# Patient Record
Sex: Male | Born: 1955 | Race: White | Hispanic: No | Marital: Married | Smoking: Never smoker
Health system: Southern US, Community
[De-identification: ages and names within clinical notes are randomized; demographics above are authoritative.]

## PROBLEM LIST (undated history)

## (undated) DIAGNOSIS — J302 Other seasonal allergic rhinitis: Secondary | ICD-10-CM

## (undated) DIAGNOSIS — E785 Hyperlipidemia, unspecified: Secondary | ICD-10-CM

## (undated) DIAGNOSIS — I10 Essential (primary) hypertension: Secondary | ICD-10-CM

## (undated) DIAGNOSIS — M1990 Unspecified osteoarthritis, unspecified site: Secondary | ICD-10-CM

## (undated) DIAGNOSIS — K219 Gastro-esophageal reflux disease without esophagitis: Secondary | ICD-10-CM

## (undated) DIAGNOSIS — Z8639 Personal history of other endocrine, nutritional and metabolic disease: Secondary | ICD-10-CM

## (undated) DIAGNOSIS — E119 Type 2 diabetes mellitus without complications: Secondary | ICD-10-CM

## (undated) HISTORY — PX: TONSILLECTOMY: SUR1361

---

## 1992-02-23 HISTORY — PX: KNEE ARTHROSCOPY: SUR90

## 2008-12-03 ENCOUNTER — Encounter: Admission: RE | Admit: 2008-12-03 | Discharge: 2008-12-03 | Payer: Self-pay | Admitting: Sports Medicine

## 2011-02-04 ENCOUNTER — Ambulatory Visit: Payer: Self-pay | Admitting: Family Medicine

## 2011-12-27 IMAGING — US US PELVIS LIMITED
1 series · 17 of 25 positions shown · non-contrast
Comparison: none

REASON FOR EXAM: left scrotal mass above testicles eval hydrocele
COMMENTS:

[Series 1: us pelvis limited · 17 of 74 slices shown]
[im 1/74]
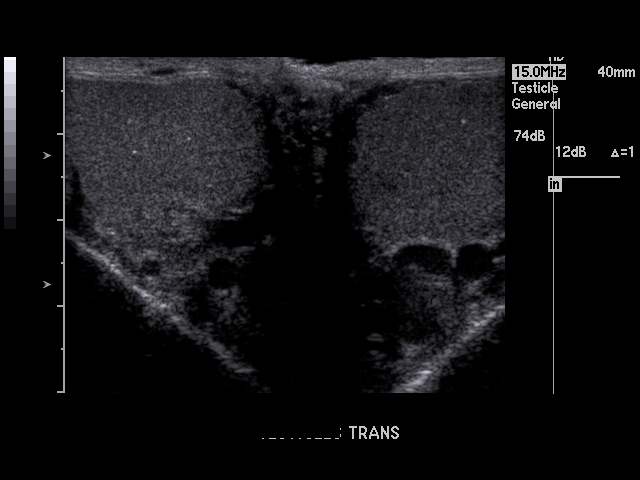
[im 7/74]
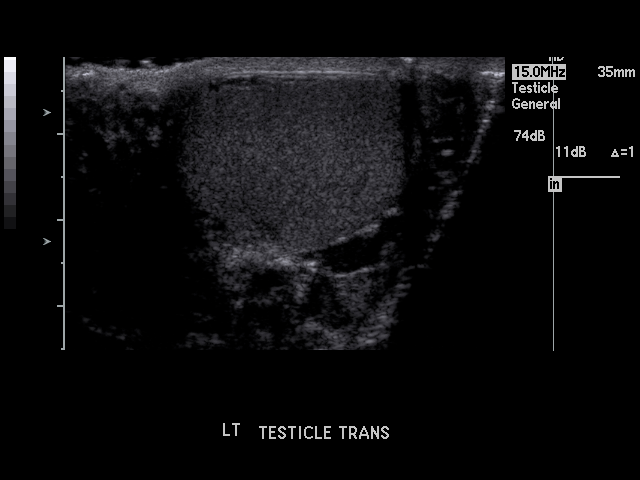
[im 10/74]
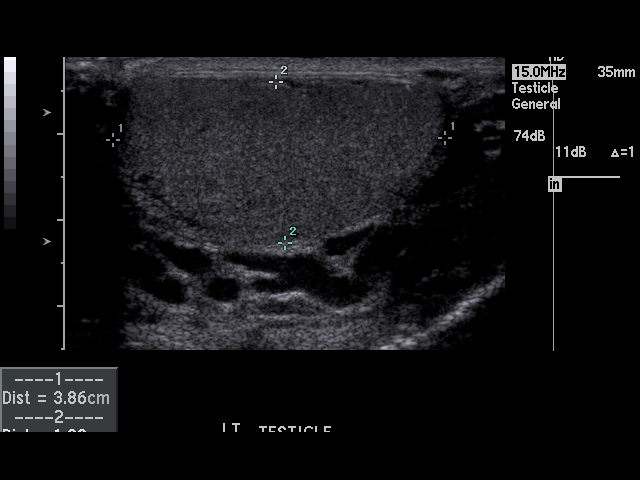
[im 16/74]
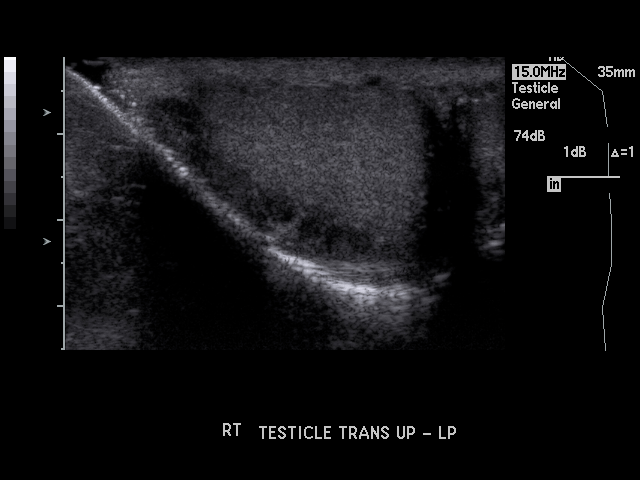
[im 19/74]
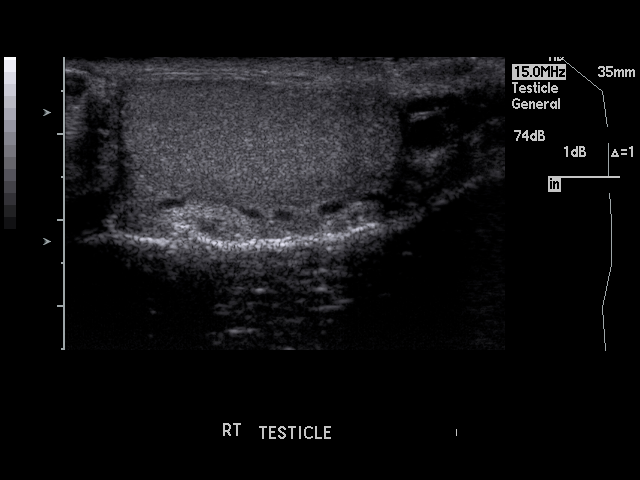
[im 25/74]
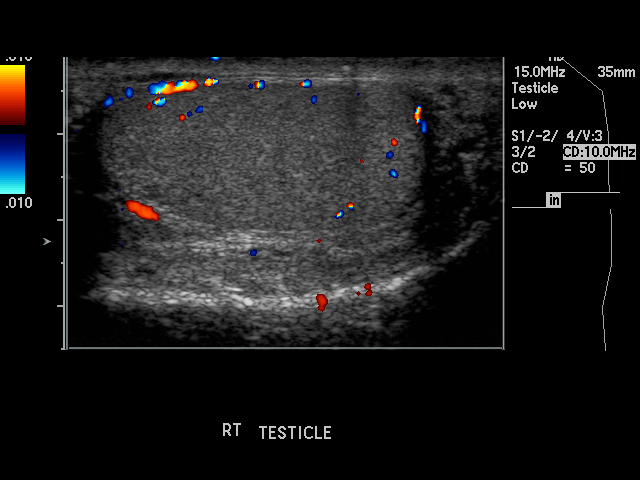
[im 28/74]
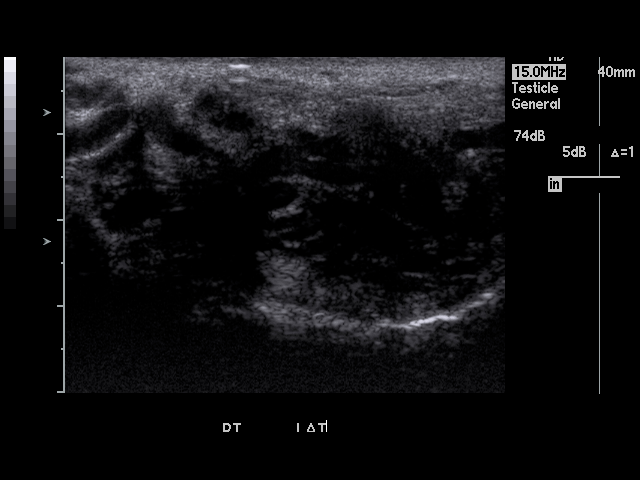
[im 34/74]
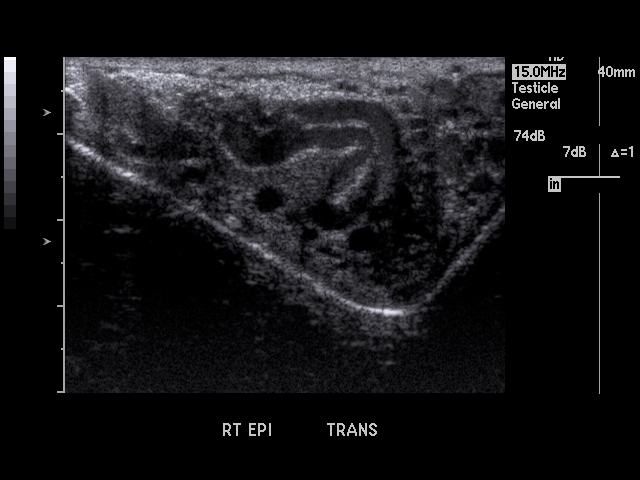
[im 37/74]
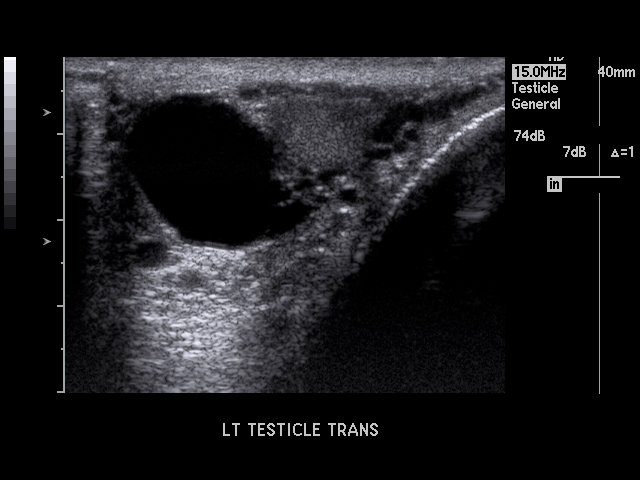
[im 40/74]
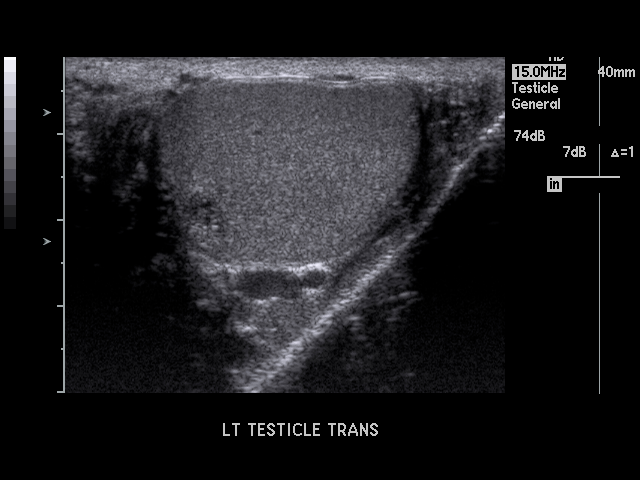
[im 46/74]
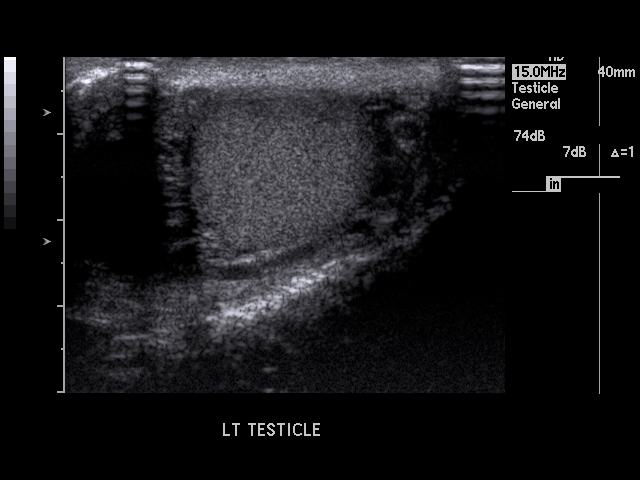
[im 49/74]
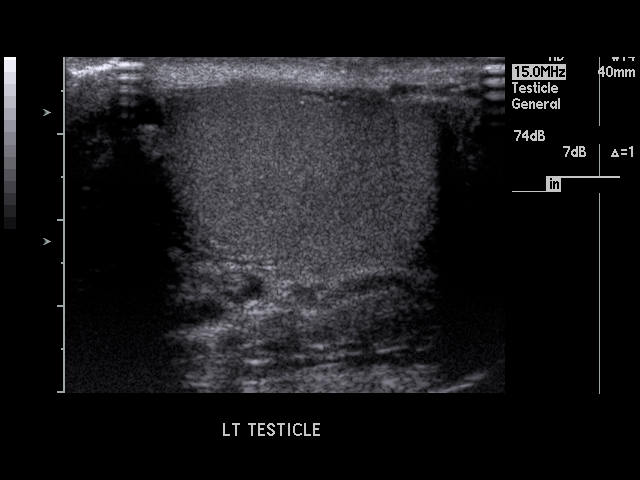
[im 55/74]
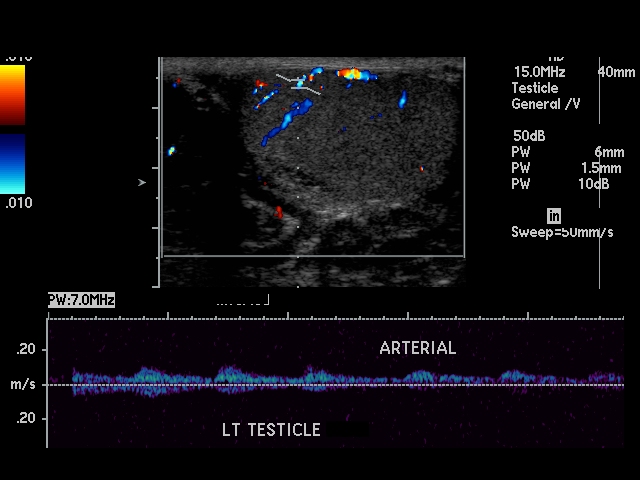
[im 58/74]
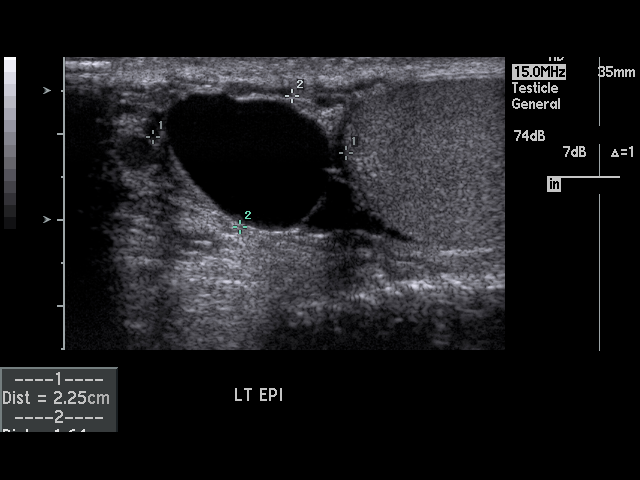
[im 64/74]
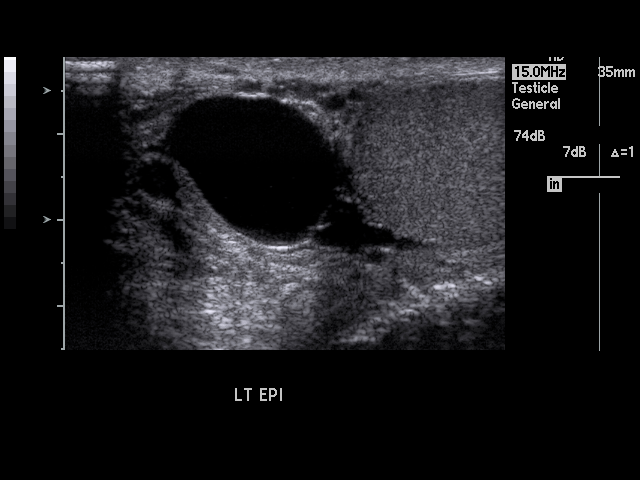
[im 67/74]
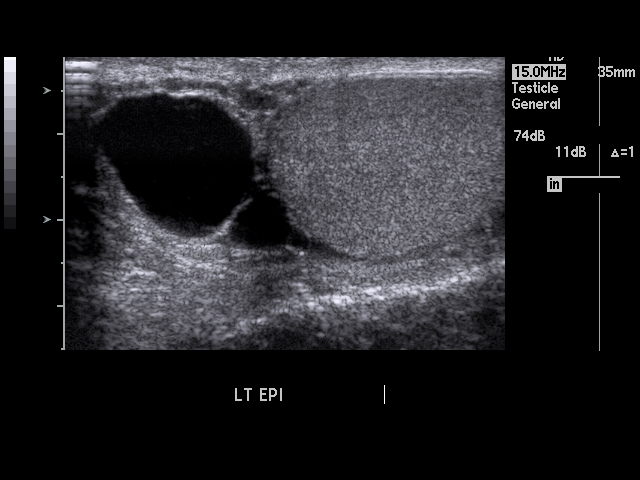
[im 74/74]
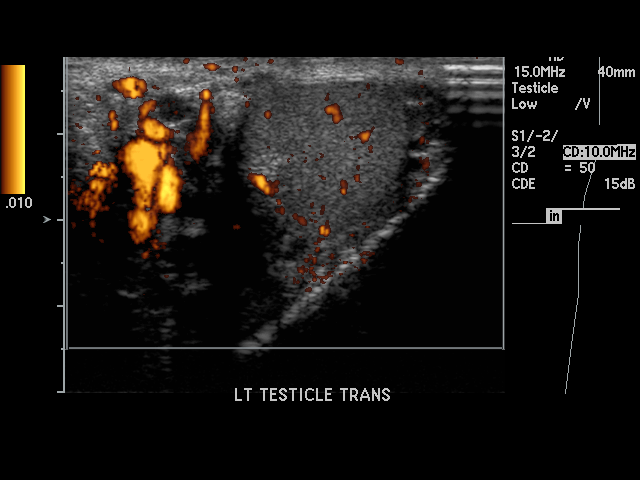

[17 of 25 positions shown; findings below may reference images not displayed]

PROCEDURE:     ALIM - ALIM TESTICULAR  - February 04, 2011  [DATE]

RESULT:

Scrotal Ultrasound demonstrates the right testicle measures 4.23 x 2.20 x
2.75 cm. The left measures 3.86 x 1.88 x 2.71 cm. Epididymal appearance is
grossly normal. A left epididymal cyst with thin septations measures 2.55 x
2.08 x 1.50 cm. Bilateral varicoceles are present. On the right, this makes
visualization of the epididymis difficult. Color Doppler and spectral
Doppler appearance demonstrates testicular blood flow appears within normal
limits bilaterally. Microcalcifications are present bilaterally within the
testicular tissue.
IMPRESSION: 1.  Bilateral varicoceles.
2.  Testicular microcalcifications.
3.  Epididymal cyst with thin septations on the left measures up to 2.55 x
2.28 x 1.50 cm.

## 2014-08-05 NOTE — Progress Notes (Signed)
Please put orders in Epic surgery 08-16-14 Thanks

## 2014-08-07 ENCOUNTER — Ambulatory Visit: Payer: Self-pay | Admitting: Orthopedic Surgery

## 2014-08-07 MED ORDER — ACETAMINOPHEN 10 MG/ML IV SOLN: 1000.0000 mg | INTRAVENOUS | Status: AC

## 2014-08-07 MED ORDER — SODIUM CHLORIDE 0.9 % IV SOLN: 4.0000 mg | INTRAVENOUS | Status: AC

## 2014-08-07 MED ORDER — DEXAMETHASONE SODIUM PHOSPHATE 4 MG/ML IJ SOLN: 4.0000 mg | INTRAMUSCULAR | Status: AC

## 2014-08-09 NOTE — Patient Instructions (Addendum)
YOUR PROCEDURE IS SCHEDULED ON : 08/16/14  REPORT TO [facility] MAIN ENTRANCE FOLLOW SIGNS TO SHORT STAY CENTER AT :  5:30 am  CALL THIS NUMBER IF YOU HAVE PROBLEMS THE MORNING OF SURGERY [phone]  REMEMBER:ONLY 1 PER PERSON MAY GO TO SHORT STAY WITH YOU TO GET READY THE MORNING OF YOUR SURGERY  DO NOT EAT FOOD OR DRINK LIQUIDS AFTER MIDNIGHT  TAKE THESE MEDICINES THE MORNING OF SURGERY:  NONE  YOU MAY NOT HAVE ANY METAL ON YOUR BODY INCLUDING HAIR PINS AND PIERCING'S. DO NOT WEAR JEWELRY, MAKEUP, LOTIONS, POWDERS OR PERFUMES. DO NOT WEAR NAIL POLISH. DO NOT SHAVE 48 HRS PRIOR TO SURGERY. MEN MAY SHAVE FACE AND NECK.  DO NOT BRING VALUABLES TO HOSPITAL. [facility] IS NOT RESPONSIBLE FOR VALUABLES.  CONTACTS, DENTURES OR PARTIALS MAY NOT BE WORN TO SURGERY. LEAVE SUITCASE IN CAR. CAN BE BROUGHT TO ROOM AFTER SURGERY.  PATIENTS DISCHARGED THE DAY OF SURGERY WILL NOT BE ALLOWED TO DRIVE HOME.  PLEASE READ OVER THE FOLLOWING INSTRUCTION SHEETS _________________________________________________________________________________                                          [facility] - PREPARING FOR SURGERY  Before surgery, you can play an important role.  Because skin is not sterile, your skin needs to be as free of germs as possible.  You can reduce the number of germs on your skin by washing with CHG (chlorahexidine gluconate) soap before surgery.  CHG is an antiseptic cleaner which kills germs and bonds with the skin to continue killing germs even after washing. Please DO NOT use if you have an allergy to CHG or antibacterial soaps.  If your skin becomes reddened/irritated stop using the CHG and inform your nurse when you arrive at Short Stay. Do not shave (including legs and underarms) for at least 48 hours prior to the first CHG shower.  You may shave your face. Please follow these instructions carefully:   1.  Shower with CHG Soap the night before surgery and the   morning of Surgery.   2.  If you choose to wash your hair, wash your hair first as usual with your  normal  Shampoo.   3.  After you shampoo, rinse your hair and body thoroughly to remove the  shampoo.                                         4.  Use CHG as you would any other liquid soap.  You can apply chg directly  to the skin and wash . Gently wash with scrungie or clean wascloth    5.  Apply the CHG Soap to your body ONLY FROM THE NECK DOWN.   Do not use on open                           Wound or open sores. Avoid contact with eyes, ears mouth and genitals (private parts).                        Genitals (private parts) with your normal soap.              6.  Wash thoroughly, paying special attention to the area where your surgery  will be performed.   7.  Thoroughly rinse your body with warm water from the neck down.   8.  DO NOT shower/wash with your normal soap after using and rinsing off  the CHG Soap .                9.  Pat yourself dry with a clean towel.             10.  Wear clean night clothes to bed after shower             11.  Place clean sheets on your bed the night of your first shower and do not  sleep with pets.  Day of Surgery : Do not apply any lotions/deodorants the morning of surgery.  Please wear clean clothes to the hospital/surgery center.  FAILURE TO FOLLOW THESE INSTRUCTIONS MAY RESULT IN THE CANCELLATION OF YOUR SURGERY    PATIENT SIGNATURE_________________________________  ______________________________________________________________________     Darren Bean  An incentive spirometer is a tool that can help keep your lungs clear and active. This tool measures how well you are filling your lungs with each breath. Taking long deep breaths may help reverse or decrease the chance of developing breathing (pulmonary) problems (especially infection) following:  A long period of time when you are unable to move or be active. BEFORE THE  PROCEDURE   If the spirometer includes an indicator to show your best effort, your nurse or respiratory therapist will set it to a desired goal.  If possible, sit up straight or lean slightly forward. Try not to slouch.  Hold the incentive spirometer in an upright position. INSTRUCTIONS FOR USE   Sit on the edge of your bed if possible, or sit up as far as you can in bed or on a chair.  Hold the incentive spirometer in an upright position.  Breathe out normally.  Place the mouthpiece in your mouth and seal your lips tightly around it.  Breathe in slowly and as deeply as possible, raising the piston or the ball toward the top of the column.  Hold your breath for 3-5 seconds or for as long as possible. Allow the piston or ball to fall to the bottom of the column.  Remove the mouthpiece from your mouth and breathe out normally.  Rest for a few seconds and repeat Steps 1 through 7 at least 10 times every 1-2 hours when you are awake. Take your time and take a few normal breaths between deep breaths.  The spirometer may include an indicator to show your best effort. Use the indicator as a goal to work toward during each repetition.  After each set of 10 deep breaths, practice coughing to be sure your lungs are clear. If you have an incision (the cut made at the time of surgery), support your incision when coughing by placing a pillow or rolled up towels firmly against it. Once you are able to get out of bed, walk around indoors and cough well. You may stop using the incentive spirometer when instructed by your caregiver.  RISKS AND COMPLICATIONS  Take your time so you do not get dizzy or light-headed.  If you are in pain, you may need to take or ask for pain medication before doing incentive spirometry. It is harder to take a deep breath if you are having pain. AFTER USE  Rest and breathe slowly and easily.  It  can be helpful to keep track of a log of your progress. Your caregiver  can provide you with a simple table to help with this. If you are using the spirometer at home, follow these instructions: SEEK MEDICAL CARE IF:   You are having difficultly using the spirometer.  You have trouble using the spirometer as often as instructed.  Your pain medication is not giving enough relief while using the spirometer.  You develop fever of 100.5 F (38.1 C) or higher. SEEK IMMEDIATE MEDICAL CARE IF:   You cough up bloody sputum that had not been present before.  You develop fever of 102 F (38.9 C) or greater.  You develop worsening pain at or near the incision site. MAKE SURE YOU:   Understand these instructions.  Will watch your condition.  Will get help right away if you are not doing well or get worse. Document Released: 06/21/2006 Document Revised: 05/03/2011 Document Reviewed: 08/22/2006 [facility] Patient Information 2014 [place], [place].

## 2014-08-12 ENCOUNTER — Encounter (HOSPITAL_COMMUNITY): Payer: Self-pay

## 2014-08-12 ENCOUNTER — Encounter (HOSPITAL_COMMUNITY)
Admission: RE | Admit: 2014-08-12 | Discharge: 2014-08-12 | Disposition: A | Discharge status: Home / Self Care | Payer: BC Managed Care – PPO | Source: Ambulatory Visit | Attending: Orthopedic Surgery | Admitting: Orthopedic Surgery

## 2014-08-12 DIAGNOSIS — M1611 Unilateral primary osteoarthritis, right hip: Secondary | ICD-10-CM | POA: Diagnosis not present

## 2014-08-12 DIAGNOSIS — Z01810 Encounter for preprocedural cardiovascular examination: Secondary | ICD-10-CM | POA: Insufficient documentation

## 2014-08-12 DIAGNOSIS — Z01812 Encounter for preprocedural laboratory examination: Secondary | ICD-10-CM | POA: Insufficient documentation

## 2014-08-12 HISTORY — DX: Unspecified osteoarthritis, unspecified site: M19.90

## 2014-08-12 HISTORY — DX: Personal history of other endocrine, nutritional and metabolic disease: Z86.39

## 2014-08-12 HISTORY — DX: Other seasonal allergic rhinitis: J30.2

## 2014-08-12 HISTORY — DX: Gastro-esophageal reflux disease without esophagitis: K21.9

## 2014-08-12 LAB — PROTIME-INR
INR: 1.01 (ref 0.00–1.49)
PROTHROMBIN TIME: 13.5 s (ref 11.6–15.2)

## 2014-08-12 LAB — COMPREHENSIVE METABOLIC PANEL
ALT: 20 U/L (ref 17–63)
AST: 16 U/L (ref 15–41)
Albumin: 4.2 g/dL (ref 3.5–5.0)
Alkaline Phosphatase: 50 U/L (ref 38–126)
Anion gap: 7 (ref 5–15)
BILIRUBIN TOTAL: 0.5 mg/dL (ref 0.3–1.2)
BUN: 19 mg/dL (ref 6–20)
CALCIUM: 8.8 mg/dL — AB (ref 8.9–10.3)
CHLORIDE: 105 mmol/L (ref 101–111)
CO2: 26 mmol/L (ref 22–32)
CREATININE: 0.94 mg/dL (ref 0.61–1.24)
GLUCOSE: 168 mg/dL — AB (ref 65–99)
Potassium: 3.8 mmol/L (ref 3.5–5.1)
Sodium: 138 mmol/L (ref 135–145)
Total Protein: 6.8 g/dL (ref 6.5–8.1)

## 2014-08-12 LAB — CBC
HCT: 45.8 % (ref 39.0–52.0)
HEMOGLOBIN: 15.2 g/dL (ref 13.0–17.0)
MCH: 28.4 pg (ref 26.0–34.0)
MCHC: 33.2 g/dL (ref 30.0–36.0)
MCV: 85.6 fL (ref 78.0–100.0)
Platelets: 201 10*3/uL (ref 150–400)
RBC: 5.35 MIL/uL (ref 4.22–5.81)
RDW: 12.6 % (ref 11.5–15.5)
WBC: 5.1 10*3/uL (ref 4.0–10.5)

## 2014-08-12 LAB — APTT: APTT: 28 s (ref 24–37)

## 2014-08-12 LAB — SURGICAL PCR SCREEN
MRSA, PCR: NEGATIVE
Staphylococcus aureus: POSITIVE — AB

## 2014-08-12 LAB — NO BLOOD PRODUCTS

## 2014-08-12 NOTE — Progress Notes (Signed)
Rx Mupuricin called to [facility] [phone] pt notified

## 2014-08-12 NOTE — Progress Notes (Signed)
   08/12/14 0815  OBSTRUCTIVE SLEEP APNEA  Have you ever been diagnosed with sleep apnea through a sleep study? No  Do you snore loudly (loud enough to be heard through closed doors)?  0  Do you often feel tired, fatigued, or sleepy during the daytime? 0  Has anyone observed you stop breathing during your sleep? 0  Do you have, or are you being treated for high blood pressure? 0  BMI more than 35 kg/m2? 1  Age over 57 years old? 1  Neck circumference greater than 40 cm/16 inches? 1  Gender: 1

## 2014-08-13 ENCOUNTER — Ambulatory Visit: Payer: Self-pay | Admitting: Orthopedic Surgery

## 2014-08-13 NOTE — H&P (Signed)
TOTAL HIP ADMISSION H&P  Patient is admitted for right total hip arthroplasty.  Subjective:  Chief Complaint: right hip pain  HPI: Darren Bean, 59 y.o. male, has a history of pain and functional disability in the right hip(s) due to arthritis and patient has failed non-surgical conservative treatments for greater than 12 weeks to include NSAID's and/or analgesics, flexibility and strengthening excercises, use of assistive devices, weight reduction as appropriate and activity modification.  Onset of symptoms was gradual starting 4 years ago with gradually worsening course since that time.The patient noted no past surgery on the right hip(s).  Patient currently rates pain in the right hip at 10 out of 10 with activity. Patient has night pain, worsening of pain with activity and weight bearing, pain that interfers with activities of daily living and pain with passive range of motion. Patient has evidence of subchondral cysts, subchondral sclerosis, periarticular osteophytes and joint space narrowing by imaging studies. This condition presents safety issues increasing the risk of falls. There is no current active infection.  There are no active problems to display for this patient.  Past Medical History  Diagnosis Date  . Seasonal allergies   . Arthritis   . GERD (gastroesophageal reflux disease)     takes tums as needed  . Hx of diabetes mellitus     taken off metformin after wt loss - glucose  became normal    Past Surgical History  Procedure Laterality Date  . Tonsillectomy    . Knee arthroscopy  1994    rt knee     (Not in a hospital admission) Allergies  Allergen Reactions  . Other     Refusal of blood- Jehova Witness.     History  Substance Use Topics  . Smoking status: Never Smoker   . Smokeless tobacco: Not on file  . Alcohol Use: Yes     Comment: social    No family history on file.   Review of Systems  Constitutional: Negative.   HENT: Negative.   Eyes: Negative.    Respiratory: Negative.   Cardiovascular: Negative.   Gastrointestinal: Negative.   Genitourinary: Negative.   Musculoskeletal: Positive for back pain and joint pain.  Skin: Negative.   Neurological: Negative.   Endo/Heme/Allergies: Negative.   Psychiatric/Behavioral: Negative.     Objective:  Physical Exam  Vitals reviewed. Constitutional: He is oriented to person, place, and time. He appears well-developed and well-nourished.  HENT:  Head: Normocephalic and atraumatic.  Eyes: Conjunctivae and EOM are normal. Pupils are equal, round, and reactive to light.  Neck: Normal range of motion. Neck supple.  Cardiovascular: Normal rate, regular rhythm, normal heart sounds and intact distal pulses.   Respiratory: Effort normal and breath sounds normal. No respiratory distress.  GI: Soft. Bowel sounds are normal. He exhibits no distension. There is no tenderness.  Genitourinary:  deferred  Musculoskeletal:       Right hip: He exhibits decreased range of motion.  Neurological: He is alert and oriented to person, place, and time. He has normal reflexes.  Skin: Skin is warm and dry.  Psychiatric: He has a normal mood and affect. His behavior is normal. Judgment and thought content normal.    Vital signs in last 24 hours: @VSRANGES @  Labs:   Estimated body mass index is 35.72 kg/(m^2) as calculated from the following:   Height as of 08/12/14: 5\' 9"  (1.753 m).   Weight as of 08/12/14: 109.77 kg (242 lb).   Imaging Review Plain radiographs demonstrate  severe degenerative joint disease of the right hip(s). The bone quality appears to be adequate for age and reported activity level.  Assessment/Plan:  End stage arthritis, right hip(s)  The patient history, physical examination, clinical judgement of the provider and imaging studies are consistent with end stage degenerative joint disease of the right hip(s) and total hip arthroplasty is deemed medically necessary. The treatment  options including medical management, injection therapy, arthroscopy and arthroplasty were discussed at length. The risks and benefits of total hip arthroplasty were presented and reviewed. The risks due to aseptic loosening, infection, stiffness, dislocation/subluxation,  thromboembolic complications and other imponderables were discussed.  The patient acknowledged the explanation, agreed to proceed with the plan and consent was signed. Patient is being admitted for inpatient treatment for surgery, pain control, PT, OT, prophylactic antibiotics, VTE prophylaxis, progressive ambulation and ADL's and discharge planning.The patient is planning to be discharged home with home health services. Jehovah's witness - no blood products.

## 2014-08-16 ENCOUNTER — Inpatient Hospital Stay (HOSPITAL_COMMUNITY): Payer: BC Managed Care – PPO

## 2014-08-16 ENCOUNTER — Encounter (HOSPITAL_COMMUNITY): Payer: Self-pay | Admitting: *Deleted

## 2014-08-16 ENCOUNTER — Inpatient Hospital Stay (HOSPITAL_COMMUNITY): Payer: BC Managed Care – PPO | Admitting: Anesthesiology

## 2014-08-16 ENCOUNTER — Inpatient Hospital Stay (HOSPITAL_COMMUNITY)
Admission: RE | Admit: 2014-08-16 | Discharge: 2014-08-17 | DRG: 470 | Disposition: A | Discharge status: Home Health | Payer: BC Managed Care – PPO | Source: Ambulatory Visit | Attending: Orthopedic Surgery | Admitting: Orthopedic Surgery

## 2014-08-16 ENCOUNTER — Encounter (HOSPITAL_COMMUNITY)
Admission: RE | Disposition: A | Discharge status: Home Health | Payer: Self-pay | Source: Ambulatory Visit | Attending: Orthopedic Surgery

## 2014-08-16 DIAGNOSIS — Z01812 Encounter for preprocedural laboratory examination: Secondary | ICD-10-CM

## 2014-08-16 DIAGNOSIS — K219 Gastro-esophageal reflux disease without esophagitis: Secondary | ICD-10-CM | POA: Diagnosis present

## 2014-08-16 DIAGNOSIS — M1611 Unilateral primary osteoarthritis, right hip: Principal | ICD-10-CM | POA: Diagnosis present

## 2014-08-16 DIAGNOSIS — M25559 Pain in unspecified hip: Secondary | ICD-10-CM

## 2014-08-16 DIAGNOSIS — Z09 Encounter for follow-up examination after completed treatment for conditions other than malignant neoplasm: Secondary | ICD-10-CM

## 2014-08-16 DIAGNOSIS — M25551 Pain in right hip: Secondary | ICD-10-CM | POA: Diagnosis present

## 2014-08-16 HISTORY — PX: TOTAL HIP ARTHROPLASTY: SHX124

## 2014-08-16 SURGERY — ARTHROPLASTY, HIP, TOTAL, ANTERIOR APPROACH
Anesthesia: Spinal | Site: Hip | Laterality: Right

## 2014-08-16 MED ORDER — BUPIVACAINE HCL (PF) 0.5 % IJ SOLN
INTRAMUSCULAR | Status: AC
  Filled 2014-08-16: qty 30

## 2014-08-16 MED ORDER — ACETAMINOPHEN 10 MG/ML IV SOLN
1000.0000 mg | Freq: Once | INTRAVENOUS | Status: AC
  Administered 2014-08-16: 1000 mg via INTRAVENOUS

## 2014-08-16 MED ORDER — PROPOFOL 10 MG/ML IV BOLUS
INTRAVENOUS | Status: AC
  Filled 2014-08-16: qty 20

## 2014-08-16 MED ORDER — MIDAZOLAM HCL 5 MG/5ML IJ SOLN
INTRAMUSCULAR | Status: DC | PRN
  Administered 2014-08-16: 2 mg via INTRAVENOUS

## 2014-08-16 MED ORDER — SENNA 8.6 MG PO TABS: 2.0000 | ORAL_TABLET | Freq: Every day | ORAL | Status: DC

## 2014-08-16 MED ORDER — CHLORHEXIDINE GLUCONATE 4 % EX LIQD: 60.0000 mL | Freq: Once | CUTANEOUS | Status: DC

## 2014-08-16 MED ORDER — METHOCARBAMOL 500 MG PO TABS
500.0000 mg | ORAL_TABLET | Freq: Four times a day (QID) | ORAL | Status: DC | PRN
  Administered 2014-08-16: 500 mg via ORAL
  Filled 2014-08-16 (×2): qty 1

## 2014-08-16 MED ORDER — ASPIRIN EC 325 MG PO TBEC
325.0000 mg | DELAYED_RELEASE_TABLET | Freq: Two times a day (BID) | ORAL | Status: DC
  Administered 2014-08-17: 325 mg via ORAL
  Filled 2014-08-16 (×5): qty 1

## 2014-08-16 MED ORDER — LACTATED RINGERS IV SOLN
INTRAVENOUS | Status: DC | PRN
  Administered 2014-08-16 (×4): via INTRAVENOUS

## 2014-08-16 MED ORDER — BUPIVACAINE HCL (PF) 0.5 % IJ SOLN
INTRAMUSCULAR | Status: DC | PRN
  Administered 2014-08-16: 15 mg via INTRATHECAL

## 2014-08-16 MED ORDER — KETOROLAC TROMETHAMINE 30 MG/ML IJ SOLN
INTRAMUSCULAR | Status: AC
  Filled 2014-08-16: qty 1

## 2014-08-16 MED ORDER — BUPIVACAINE HCL (PF) 0.25 % IJ SOLN
INTRAMUSCULAR | Status: AC
  Filled 2014-08-16: qty 30

## 2014-08-16 MED ORDER — SODIUM CHLORIDE 0.9 % IR SOLN
Status: DC | PRN
Start: 2014-08-16 — End: 2014-08-16
  Administered 2014-08-16: 1000 mL

## 2014-08-16 MED ORDER — HYDROMORPHONE HCL 1 MG/ML IJ SOLN: 0.5000 mg | INTRAMUSCULAR | Status: DC | PRN

## 2014-08-16 MED ORDER — ISOPROPYL ALCOHOL 70 % SOLN
Status: DC | PRN
  Administered 2014-08-16: 1 via TOPICAL

## 2014-08-16 MED ORDER — ACETAMINOPHEN 325 MG PO TABS: 650.0000 mg | ORAL_TABLET | Freq: Four times a day (QID) | ORAL | Status: DC | PRN

## 2014-08-16 MED ORDER — DEXAMETHASONE SODIUM PHOSPHATE 10 MG/ML IJ SOLN
INTRAMUSCULAR | Status: DC | PRN
  Administered 2014-08-16: 10 mg via INTRAVENOUS

## 2014-08-16 MED ORDER — METOCLOPRAMIDE HCL 10 MG PO TABS: 5.0000 mg | ORAL_TABLET | Freq: Three times a day (TID) | ORAL | Status: DC | PRN

## 2014-08-16 MED ORDER — CEFAZOLIN SODIUM-DEXTROSE 2-3 GM-% IV SOLR
2.0000 g | INTRAVENOUS | Status: AC
  Administered 2014-08-16: 2 g via INTRAVENOUS

## 2014-08-16 MED ORDER — HYDROCODONE-ACETAMINOPHEN 5-325 MG PO TABS
1.0000 | ORAL_TABLET | ORAL | Status: DC | PRN
  Administered 2014-08-16 (×2): 2 via ORAL
  Filled 2014-08-16 (×2): qty 2

## 2014-08-16 MED ORDER — DEXAMETHASONE SODIUM PHOSPHATE 10 MG/ML IJ SOLN
10.0000 mg | Freq: Once | INTRAMUSCULAR | Status: AC
  Administered 2014-08-17: 10 mg via INTRAVENOUS
  Filled 2014-08-16: qty 1

## 2014-08-16 MED ORDER — SODIUM CHLORIDE 0.9 % IV SOLN: INTRAVENOUS | Status: DC

## 2014-08-16 MED ORDER — MENTHOL 3 MG MT LOZG
1.0000 | LOZENGE | OROMUCOSAL | Status: DC | PRN
  Filled 2014-08-16: qty 9

## 2014-08-16 MED ORDER — METHOCARBAMOL 1000 MG/10ML IJ SOLN
500.0000 mg | Freq: Four times a day (QID) | INTRAVENOUS | Status: DC | PRN
  Administered 2014-08-16: 500 mg via INTRAVENOUS
  Filled 2014-08-16 (×2): qty 5

## 2014-08-16 MED ORDER — FENTANYL CITRATE (PF) 250 MCG/5ML IJ SOLN
INTRAMUSCULAR | Status: AC
  Filled 2014-08-16: qty 5

## 2014-08-16 MED ORDER — PHENYLEPHRINE 40 MCG/ML (10ML) SYRINGE FOR IV PUSH (FOR BLOOD PRESSURE SUPPORT)
PREFILLED_SYRINGE | INTRAVENOUS | Status: AC
  Filled 2014-08-16: qty 10

## 2014-08-16 MED ORDER — HYDROGEN PEROXIDE 3 % EX SOLN
CUTANEOUS | Status: AC
  Filled 2014-08-16: qty 473

## 2014-08-16 MED ORDER — ONDANSETRON HCL 4 MG/2ML IJ SOLN
INTRAMUSCULAR | Status: DC | PRN
  Administered 2014-08-16: 4 mg via INTRAVENOUS

## 2014-08-16 MED ORDER — SODIUM CHLORIDE 0.9 % IJ SOLN
INTRAMUSCULAR | Status: AC
  Filled 2014-08-16: qty 50

## 2014-08-16 MED ORDER — METOCLOPRAMIDE HCL 5 MG/ML IJ SOLN
5.0000 mg | Freq: Three times a day (TID) | INTRAMUSCULAR | Status: DC | PRN
Start: 2014-08-16 — End: 2014-08-17

## 2014-08-16 MED ORDER — SODIUM CHLORIDE 0.9 % IV SOLN
1000.0000 mg | INTRAVENOUS | Status: AC
  Administered 2014-08-16: 1000 mg via INTRAVENOUS
  Filled 2014-08-16: qty 10

## 2014-08-16 MED ORDER — CEFAZOLIN SODIUM-DEXTROSE 2-3 GM-% IV SOLR
INTRAVENOUS | Status: AC
  Filled 2014-08-16: qty 50

## 2014-08-16 MED ORDER — PROPOFOL INFUSION 10 MG/ML OPTIME
INTRAVENOUS | Status: DC | PRN
  Administered 2014-08-16: 70 ug/kg/min via INTRAVENOUS

## 2014-08-16 MED ORDER — ACETAMINOPHEN 650 MG RE SUPP: 650.0000 mg | Freq: Four times a day (QID) | RECTAL | Status: DC | PRN

## 2014-08-16 MED ORDER — KETOROLAC TROMETHAMINE 30 MG/ML IJ SOLN
INTRAMUSCULAR | Status: DC | PRN
  Administered 2014-08-16: 30 mg

## 2014-08-16 MED ORDER — ONDANSETRON HCL 4 MG/2ML IJ SOLN: 4.0000 mg | Freq: Four times a day (QID) | INTRAMUSCULAR | Status: DC | PRN

## 2014-08-16 MED ORDER — WATER FOR IRRIGATION, STERILE IR SOLN
Status: DC | PRN
  Administered 2014-08-16: 1000 mL

## 2014-08-16 MED ORDER — DOCUSATE SODIUM 100 MG PO CAPS
100.0000 mg | ORAL_CAPSULE | Freq: Two times a day (BID) | ORAL | Status: DC
  Administered 2014-08-16 – 2014-08-17 (×2): 100 mg via ORAL
  Filled 2014-08-16 (×3): qty 1

## 2014-08-16 MED ORDER — CEFAZOLIN SODIUM-DEXTROSE 2-3 GM-% IV SOLR
2.0000 g | Freq: Four times a day (QID) | INTRAVENOUS | Status: AC
  Administered 2014-08-16 (×2): 2 g via INTRAVENOUS
  Filled 2014-08-16 (×2): qty 50

## 2014-08-16 MED ORDER — DEXAMETHASONE SODIUM PHOSPHATE 10 MG/ML IJ SOLN
INTRAMUSCULAR | Status: AC
  Filled 2014-08-16: qty 1

## 2014-08-16 MED ORDER — HYDROMORPHONE HCL 1 MG/ML IJ SOLN
0.2500 mg | INTRAMUSCULAR | Status: DC | PRN
  Administered 2014-08-16 (×2): 0.5 mg via INTRAVENOUS

## 2014-08-16 MED ORDER — ISOPROPYL ALCOHOL 70 % SOLN
Status: AC
  Filled 2014-08-16: qty 480

## 2014-08-16 MED ORDER — HYDROGEN PEROXIDE 3 % EX SOLN
CUTANEOUS | Status: DC | PRN
  Administered 2014-08-16: 1

## 2014-08-16 MED ORDER — SODIUM CHLORIDE 0.9 % IV SOLN
INTRAVENOUS | Status: DC
  Administered 2014-08-16 (×2): via INTRAVENOUS

## 2014-08-16 MED ORDER — FENTANYL CITRATE (PF) 100 MCG/2ML IJ SOLN
INTRAMUSCULAR | Status: DC | PRN
Start: 2014-08-16 — End: 2014-08-16
  Administered 2014-08-16: 50 ug via INTRAVENOUS
  Administered 2014-08-16: 100 ug via INTRAVENOUS

## 2014-08-16 MED ORDER — SODIUM CHLORIDE 0.9 % IJ SOLN
INTRAMUSCULAR | Status: DC | PRN
  Administered 2014-08-16: 30 mL

## 2014-08-16 MED ORDER — KETOROLAC TROMETHAMINE 15 MG/ML IJ SOLN
15.0000 mg | Freq: Four times a day (QID) | INTRAMUSCULAR | Status: AC
  Administered 2014-08-16 – 2014-08-17 (×4): 15 mg via INTRAVENOUS
  Filled 2014-08-16 (×4): qty 1

## 2014-08-16 MED ORDER — ACETAMINOPHEN 10 MG/ML IV SOLN
INTRAVENOUS | Status: AC
  Filled 2014-08-16: qty 100

## 2014-08-16 MED ORDER — PHENYLEPHRINE HCL 10 MG/ML IJ SOLN
INTRAMUSCULAR | Status: DC | PRN
  Administered 2014-08-16 (×5): 80 ug via INTRAVENOUS

## 2014-08-16 MED ORDER — HYDROMORPHONE HCL 1 MG/ML IJ SOLN
INTRAMUSCULAR | Status: AC
  Filled 2014-08-16: qty 1

## 2014-08-16 MED ORDER — MIDAZOLAM HCL 2 MG/2ML IJ SOLN
INTRAMUSCULAR | Status: AC
  Filled 2014-08-16: qty 2

## 2014-08-16 MED ORDER — PHENOL 1.4 % MT LIQD
1.0000 | OROMUCOSAL | Status: DC | PRN
  Filled 2014-08-16: qty 177

## 2014-08-16 MED ORDER — ONDANSETRON HCL 4 MG/2ML IJ SOLN
INTRAMUSCULAR | Status: AC
  Filled 2014-08-16: qty 2

## 2014-08-16 MED ORDER — ONDANSETRON HCL 4 MG PO TABS: 4.0000 mg | ORAL_TABLET | Freq: Four times a day (QID) | ORAL | Status: DC | PRN

## 2014-08-16 SURGICAL SUPPLY — 45 items
BAG DECANTER FOR FLEXI CONT (MISCELLANEOUS) IMPLANT
BAG ZIPLOCK 12X15 (MISCELLANEOUS) IMPLANT
CAPT HIP TOTAL 2 ×3 IMPLANT
CHLORAPREP W/TINT 26ML (MISCELLANEOUS) ×3 IMPLANT
COVER PERINEAL POST (MISCELLANEOUS) ×3 IMPLANT
DECANTER SPIKE VIAL GLASS SM (MISCELLANEOUS) ×3 IMPLANT
DRAPE C-ARM 42X120 X-RAY (DRAPES) ×3 IMPLANT
DRAPE STERI IOBAN 125X83 (DRAPES) ×3 IMPLANT
DRAPE U-SHAPE 47X51 STRL (DRAPES) ×9 IMPLANT
DRSG AQUACEL AG ADV 3.5X10 (GAUZE/BANDAGES/DRESSINGS) ×3 IMPLANT
ELECT BLADE TIP CTD 4 INCH (ELECTRODE) ×3 IMPLANT
ELECT PENCIL ROCKER SW 15FT (MISCELLANEOUS) ×3 IMPLANT
ELECT REM PT RETURN 15FT ADLT (MISCELLANEOUS) ×3 IMPLANT
FACESHIELD WRAPAROUND (MASK) ×6 IMPLANT
GAUZE SPONGE 4X4 12PLY STRL (GAUZE/BANDAGES/DRESSINGS) ×3 IMPLANT
GLOVE BIO SURGEON STRL SZ8.5 (GLOVE) ×6 IMPLANT
GLOVE BIOGEL PI IND STRL 8.5 (GLOVE) ×1 IMPLANT
GLOVE BIOGEL PI INDICATOR 8.5 (GLOVE) ×2
GOWN SPEC L3 XXLG W/TWL (GOWN DISPOSABLE) ×3 IMPLANT
HANDPIECE INTERPULSE COAX TIP (DISPOSABLE) ×2
HOLDER FOLEY CATH W/STRAP (MISCELLANEOUS) ×3 IMPLANT
HOOD PEEL AWAY FACE SHEILD DIS (HOOD) ×6 IMPLANT
KIT BASIN OR (CUSTOM PROCEDURE TRAY) ×3 IMPLANT
LIQUID BAND (GAUZE/BANDAGES/DRESSINGS) ×3 IMPLANT
NEEDLE SPNL 18GX3.5 QUINCKE PK (NEEDLE) ×3 IMPLANT
PACK TOTAL JOINT (CUSTOM PROCEDURE TRAY) ×3 IMPLANT
PEN SKIN MARKING BROAD (MISCELLANEOUS) ×3 IMPLANT
SAW OSC TIP CART 19.5X105X1.3 (SAW) ×3 IMPLANT
SEALER BIPOLAR AQUA 6.0 (INSTRUMENTS) ×3 IMPLANT
SET HNDPC FAN SPRY TIP SCT (DISPOSABLE) ×1 IMPLANT
SOL PREP POV-IOD 4OZ 10% (MISCELLANEOUS) ×3 IMPLANT
SUT ETHIBOND NAB CT1 #1 30IN (SUTURE) ×6 IMPLANT
SUT MNCRL AB 3-0 PS2 18 (SUTURE) ×3 IMPLANT
SUT MON AB 2-0 CT1 36 (SUTURE) ×6 IMPLANT
SUT VIC AB 1 CT1 36 (SUTURE) ×3 IMPLANT
SUT VIC AB 2-0 CT1 27 (SUTURE) ×2
SUT VIC AB 2-0 CT1 TAPERPNT 27 (SUTURE) ×1 IMPLANT
SUT VLOC 180 0 24IN GS25 (SUTURE) ×3 IMPLANT
SYR 50ML LL SCALE MARK (SYRINGE) ×3 IMPLANT
TOWEL OR 17X26 10 PK STRL BLUE (TOWEL DISPOSABLE) ×3 IMPLANT
TOWEL OR NON WOVEN STRL DISP B (DISPOSABLE) ×3 IMPLANT
TRAY FOLEY CATH 16FRSI W/METER (SET/KITS/TRAYS/PACK) ×3 IMPLANT
TRAY FOLEY W/METER SILVER 14FR (SET/KITS/TRAYS/PACK) IMPLANT
WATER STERILE IRR 1500ML POUR (IV SOLUTION) ×3 IMPLANT
YANKAUER SUCT BULB TIP 10FT TU (MISCELLANEOUS) ×3 IMPLANT

## 2014-08-16 NOTE — Op Note (Signed)
OPERATIVE REPORT  SURGEON: Samson Frederic, MD   ASSISTANT: Velta Addison, RNFA.  PREOPERATIVE DIAGNOSIS: Right hip arthritis.   POSTOPERATIVE DIAGNOSIS: Right hip arthritis.   PROCEDURE: Right total hip arthroplasty, anterior approach.   IMPLANTS: DePuy Tri Lock stem, size 6, hi offset. DePuy Pinnacle Cup, size 60 mm. DePuy Altrx liner, size 36 by 60 mm. DePuy Biolox ceramic head ball, size 36 + 1.5 mm. 6.5 mm cancellous bone screws x2.  ANESTHESIA:  Spinal  ESTIMATED BLOOD LOSS: 600 mL.  ANTIBIOTICS: 2g ancef.  DRAINS: None.  COMPLICATIONS: None.   CONDITION: PACU - hemodynamically stable.Marland Kitchen   BRIEF CLINICAL NOTE: Darren Bean is a 59 y.o. male with a long-standing history of Right hip arthritis. After failing conservative management, the patient was indicated for total hip arthroplasty. The risks, benefits, and alternatives to the procedure were explained, and the patient elected to proceed.  PROCEDURE IN DETAIL: Surgical site was marked by myself. Spinal anesthesia was obtained in the pre-op holding area. Once inside the operative room, a foley catheter was inserted. The patient was then positioned on the Hana table. All bony prominences were well padded. The hip was prepped and draped in the normal sterile surgical fashion. A time-out was called verifying side and site of surgery. The patient received IV antibiotics within 60 minutes of beginning the procedure.  The direct anterior approach to the hip was performed through the Hueter interval. Lateral femoral circumflex vessels were treated with the Auqumantys. The anterior capsule was exposed and an inverted T capsulotomy was made.The femoral neck cut was made to the level of the templated cut. A corkscrew was placed into the head and the head was removed. The femoral head was found to have eburnated bone. The head was passed to the back table and was measured.  Acetabular exposure was achieved, and the  pulvinar and labrum were excised. Sequental reaming of the acetabulum was then performed up to a size 59 mm reamer. A 60 mm cup was then opened and impacted into place at approximately 40 degrees of abduction and 20 degrees of anteversion. The final polyethylene liner was impacted into place and acetabular osteophytes were removed. I elected to augment the already good fixation with 6.5 mm cancellous screws x2.   I then gained femoral exposure taking care to protect the abductors and greater trochanter. This was performed using standard external rotation, extension, and adduction. The capsule was peeled off the inner aspect of the greater trochanter, taking care to preserve the short external rotators. A cookie cutter was used to enter the femoral canal, and then the femoral canal finder was placed. Sequential broaching was performed up to a size 6. Calcar planer was used on the femoral neck remnant. I paced a hi offset neck and a trial head ball. The hip was reduced. Leg lengths and offset were checked fluoroscopically. The hip was dislocated and trial components were removed. The final implants were placed, and the hip was reduced.  Fluoroscopy was used to confirm component position and leg lengths. At 90 degrees of external rotation and full extension, the hip was stable to an anterior directed force.  The wound was copiously irrigated with a dilute betadine solution followed by normal saline. Marcaine solution was injected into the periarticular soft tissue. The wound was closed in layers using #1 Vicryl and V-Loc for the fascia, 2-0 Vicryl for the subcutaneous fat, 2-0 Monocryl for the deep dermal layer, 3-0 running Monocryl subcuticular stitch, and Dermabond for the skin. Once  the glue was fully dried, an Aquacell Ag dressing was applied. The patient was transported to the recovery room in stable condition. Sponge, needle, and instrument counts were correct at the end of the case x2. The  patient tolerated the procedure well and there were no known complications.

## 2014-08-16 NOTE — Interval H&P Note (Signed)
History and Physical Interval Note:  08/16/2014 7:27 AM  Darren Bean  has presented today for surgery, with the diagnosis of RIGHT HIP OA   The various methods of treatment have been discussed with the patient and family. After consideration of risks, benefits and other options for treatment, the patient has consented to  Procedure(s): TOTAL RIGHT HIP ARTHROPLASTY ANTERIOR APPROACH (Right) as a surgical intervention .  The patient's history has been reviewed, patient examined, no change in status, stable for surgery.  I have reviewed the patient's chart and labs.  Questions were answered to the patient's satisfaction.     [name], Cloyde Reams

## 2014-08-16 NOTE — Anesthesia Postprocedure Evaluation (Signed)
  Anesthesia Post-op Note  Patient: Darren Bean  Procedure(s) Performed: Procedure(s) (LRB): TOTAL RIGHT HIP ARTHROPLASTY ANTERIOR APPROACH (Right)  Patient Location: PACU  Anesthesia Type: Spinal  Level of Consciousness: awake and alert   Airway and Oxygen Therapy: Patient Spontanous Breathing  Post-op Pain: mild  Post-op Assessment: Post-op Vital signs reviewed, Patient's Cardiovascular Status Stable, Respiratory Function Stable, Patent Airway and No signs of Nausea or vomiting. Spinal level has regressed more than 3 levels and BLE movement observed.  Last Vitals:  Filed Vitals:   08/16/14 1215  BP: 114/72  Pulse: 82  Temp: 36.7 C  Resp: 12    Post-op Vital Signs: stable   Complications: No apparent anesthesia complications

## 2014-08-16 NOTE — Anesthesia Preprocedure Evaluation (Addendum)
Anesthesia Evaluation  Patient identified by MRN, date of birth, ID band Patient awake    Reviewed: Allergy & Precautions, NPO status , Patient's Chart, lab work & pertinent test results  Airway Mallampati: II  TM Distance: >3 FB Neck ROM: Full    Dental no notable dental hx.    Pulmonary neg pulmonary ROS,  breath sounds clear to auscultation  Pulmonary exam normal       Cardiovascular negative cardio ROS Normal cardiovascular examRhythm:Regular Rate:Normal     Neuro/Psych negative neurological ROS  negative psych ROS   GI/Hepatic Neg liver ROS, GERD-  Medicated,  Endo/Other  negative endocrine ROS  Renal/GU negative Renal ROS  negative genitourinary   Musculoskeletal  (+) Arthritis -,   Abdominal   Peds negative pediatric ROS (+)  Hematology negative hematology ROS (+)   Anesthesia Other Findings   Reproductive/Obstetrics negative OB ROS                             Anesthesia Physical Anesthesia Plan  ASA: II  Anesthesia Plan: Spinal   Post-op Pain Management:    Induction: Intravenous  Airway Management Planned: Natural Airway  Additional Equipment:   Intra-op Plan:   Post-operative Plan:   Informed Consent: I have reviewed the patients History and Physical, chart, labs and discussed the procedure including the risks, benefits and alternatives for the proposed anesthesia with the patient or authorized representative who has indicated his/her understanding and acceptance.   Dental advisory given  Plan Discussed with: CRNA  Anesthesia Plan Comments: (Discussed risks and benefits of and differences between spinal and general. Discussed risks of spinal including headache, backache, failure, bleeding, infection, and nerve damage. Patient consents to spinal. Questions answered. Coagulation studies and platelet count acceptable.)       Anesthesia Quick Evaluation

## 2014-08-16 NOTE — Transfer of Care (Signed)
Immediate Anesthesia Transfer of Care Note  Patient: Darren Bean  Procedure(s) Performed: Procedure(s): TOTAL RIGHT HIP ARTHROPLASTY ANTERIOR APPROACH (Right)  Patient Location: PACU  Anesthesia Type:Spinal  Level of Consciousness: sedated  Airway & Oxygen Therapy: Patient Spontanous Breathing and Patient connected to face mask oxygen  Post-op Assessment: Report given to RN and Post -op Vital signs reviewed and stable  Post vital signs: Reviewed and stable  Last Vitals:  Filed Vitals:   08/16/14 0525  BP: 145/92  Pulse: 72  Temp: 36.4 C  Resp: 18    Complications: No apparent anesthesia complications

## 2014-08-16 NOTE — Discharge Instructions (Signed)
°Dr. Ashaunte Standley °Joint Replacement Specialist °[facility] °[address] °[place], [place] [address] °[phone] ° ° °TOTAL HIP REPLACEMENT POSTOPERATIVE DIRECTIONS ° ° ° °Hip Rehabilitation, Guidelines Following Surgery  ° °WEIGHT BEARING °Weight bearing as tolerated with assist device (walker, cane, etc) as directed, use it as long as suggested by your surgeon or therapist, typically at least 4-6 weeks. ° °The results of a hip operation are greatly improved after range of motion and muscle strengthening exercises. Follow all safety measures which are given to protect your hip. If any of these exercises cause increased pain or swelling in your joint, decrease the amount until you are comfortable again. Then slowly increase the exercises. Call your caregiver if you have problems or questions.  ° °HOME CARE INSTRUCTIONS  °Most of the following instructions are designed to prevent the dislocation of your new hip.  °Remove items at home which could result in a fall. This includes throw rugs or furniture in walking pathways.  °Continue medications as instructed at time of discharge. °· You may have some home medications which will be placed on hold until you complete the course of blood thinner medication. °· You may start showering once you are discharged home. Do not remove your dressing. °Do not put on socks or shoes without following the instructions of your caregivers.   °Sit on chairs with arms. Use the chair arms to help push yourself up when arising.  °Arrange for the use of a toilet seat elevator so you are not sitting low.  °· Walk with walker as instructed.  °You may resume a sexual relationship in one month or when given the OK by your caregiver.  °Use walker as long as suggested by your caregivers.  °You may put full weight on your legs and walk as much as is comfortable. °Avoid periods of inactivity such as sitting longer than an hour when not asleep. This helps prevent  blood clots.  °You may return to work once you are cleared by your surgeon.  °Do not drive a car for 6 weeks or until released by your surgeon.  °Do not drive while taking narcotics.  °Wear elastic stockings for two weeks following surgery during the day but you may remove then at night.  °Make sure you keep all of your appointments after your operation with all of your doctors and caregivers. You should call the office at the above phone number and make an appointment for approximately two weeks after the date of your surgery. °Please pick up a stool softener and laxative for home use as long as you are requiring pain medications. °· ICE to the affected hip every three hours for 30 minutes at a time and then as needed for pain and swelling. Continue to use ice on the hip for pain and swelling from surgery. You may notice swelling that will progress down to the foot and ankle.  This is normal after surgery.  Elevate the leg when you are not up walking on it.   °It is important for you to complete the blood thinner medication as prescribed by your doctor. °· Continue to use the breathing machine which will help keep your temperature down.  It is common for your temperature to cycle up and down following surgery, especially at night when you are not up moving around and exerting yourself.  The breathing machine keeps your lungs expanded and your temperature down. ° °RANGE OF MOTION AND STRENGTHENING EXERCISES  °These exercises are   designed to help you keep full movement of your hip joint. Follow your caregiver's or physical therapist's instructions. Perform all exercises about fifteen times, three times per day or as directed. Exercise both hips, even if you have had only one joint replacement. These exercises can be done on a training (exercise) mat, on the floor, on a table or on a bed. Use whatever works the best and is most comfortable for you. Use music or television while you are exercising so that the exercises  are a pleasant break in your day. This will make your life better with the exercises acting as a break in routine you can look forward to.  °Lying on your back, slowly slide your foot toward your buttocks, raising your knee up off the floor. Then slowly slide your foot back down until your leg is straight again.  °Lying on your back spread your legs as far apart as you can without causing discomfort.  °Lying on your side, raise your upper leg and foot straight up from the floor as far as is comfortable. Slowly lower the leg and repeat.  °Lying on your back, tighten up the muscle in the front of your thigh (quadriceps muscles). You can do this by keeping your leg straight and trying to raise your heel off the floor. This helps strengthen the largest muscle supporting your knee.  °Lying on your back, tighten up the muscles of your buttocks both with the legs straight and with the knee bent at a comfortable angle while keeping your heel on the floor.  ° °SKILLED REHAB INSTRUCTIONS: °If the patient is transferred to a skilled rehab facility following release from the hospital, a list of the current medications will be sent to the facility for the patient to continue.  When discharged from the skilled rehab facility, please have the facility set up the patient's Home Health Physical Therapy prior to being released. Also, the skilled facility will be responsible for providing the patient with their medications at time of release from the facility to include their pain medication and their blood thinner medication. If the patient is still at the rehab facility at time of the two week follow up appointment, the skilled rehab facility will also need to assist the patient in arranging follow up appointment in our office and any transportation needs. ° °MAKE SURE YOU:  °Understand these instructions.  °Will watch your condition.  °Will get help right away if you are not doing well or get worse. ° °Pick up stool softner and  laxative for home use following surgery while on pain medications. °Do not remove your dressing. °The dressing is waterproof--it is OK to take showers. °Continue to use ice for pain and swelling after surgery. °Do not use any lotions or creams on the incision until instructed by your surgeon. °Total Hip Protocol. ° ° °

## 2014-08-16 NOTE — Anesthesia Procedure Notes (Signed)
Spinal Patient location during procedure: OR Start time: 08/16/2014 7:35 AM End time: 08/16/2014 7:45 AM Staffing Anesthesiologist: Sherrian Divers Resident/CRNA: Carmelia Roller R Performed by: anesthesiologist and resident/CRNA  Preanesthetic Checklist Completed: patient identified, site marked, surgical consent, pre-op evaluation, timeout performed, IV checked, risks and benefits discussed and monitors and equipment checked Spinal Block Patient position: sitting Prep: Betadine Patient monitoring: heart rate, cardiac monitor, continuous pulse ox and blood pressure Location: L3-4 Needle Needle type: Spinocan  Needle gauge: 22 G Needle length: 9 cm Needle insertion depth: 9 cm Assessment Sensory level: T6 Additional Notes Expiration date 11/2015. Attempted 24 g sprotte x 2. Dr. Knox Royalty able to be successful with 22 G spinocan needle

## 2014-08-16 NOTE — Discharge Summary (Signed)
. Physician Discharge Summary  Patient ID: Darren Bean MRN: [id number] DOB/AGE: 1955/10/18 59 y.o.  Admit date: 08/16/2014 Discharge date: 08/17/2014  Admission Diagnoses:  Primary osteoarthritis of right hip  Discharge Diagnoses:  Principal Problem:   Primary osteoarthritis of right hip   Past Medical History  Diagnosis Date  . Seasonal allergies   . Arthritis   . GERD (gastroesophageal reflux disease)     takes tums as needed  . Hx of diabetes mellitus     taken off metformin after wt loss - glucose  became normal    Surgeries: Procedure(s): TOTAL RIGHT HIP ARTHROPLASTY ANTERIOR APPROACH on 08/16/2014   Consultants (if any):    Discharged Condition: Improved  Hospital Course: Darren Bean is an 59 y.o. male who was admitted 08/16/2014 with a diagnosis of Primary osteoarthritis of right hip and went to the operating room on 08/16/2014 and underwent the above named procedures.    He was given perioperative antibiotics:      Anti-infectives    Start     Dose/Rate Route Frequency Ordered Stop   08/16/14 1400  ceFAZolin (ANCEF) IVPB 2 g/50 mL premix     2 g 100 mL/hr over 30 Minutes Intravenous Every 6 hours 08/16/14 1232 08/16/14 2026   08/16/14 0530  ceFAZolin (ANCEF) IVPB 2 g/50 mL premix     2 g 100 mL/hr over 30 Minutes Intravenous On call to O.R. 08/16/14 0530 08/16/14 0745    .  He was given sequential compression devices, early ambulation, and ASA for DVT prophylaxis.  He benefited maximally from the hospital stay and there were no complications.    Recent vital signs:  Filed Vitals:   08/17/14 0600  BP: 126/80  Pulse: 92  Temp: 96.9 F (36.1 C)  Resp: 18    Recent laboratory studies:  Lab Results  Component Value Date   HGB 12.0* 08/17/2014   HGB 15.2 08/12/2014   Lab Results  Component Value Date   WBC 13.6* 08/17/2014   PLT 215 08/17/2014   Lab Results  Component Value Date   INR 1.01 08/12/2014   Lab Results  Component Value Date   NA 135 08/17/2014   K 4.0 08/17/2014   CL 100* 08/17/2014   CO2 28 08/17/2014   BUN 16 08/17/2014   CREATININE 1.06 08/17/2014   GLUCOSE 236* 08/17/2014    Discharge Medications:     Medication List    TAKE these medications        aspirin 325 MG EC tablet  Take 1 tablet (325 mg total) by mouth 2 (two) times daily after a meal.     calcium carbonate 500 MG chewable tablet  Commonly known as:  TUMS - dosed in mg elemental calcium  Chew 2 tablets by mouth 2 (two) times daily as needed for indigestion or heartburn.     docusate sodium 100 MG capsule  Commonly known as:  COLACE  Take 1 capsule (100 mg total) by mouth 2 (two) times daily.     HYDROcodone-acetaminophen 5-325 MG per tablet  Commonly known as:  NORCO/VICODIN  Take 1-2 tablets by mouth every 4 (four) hours as needed (breakthrough pain).     meloxicam 15 MG tablet  Commonly known as:  MOBIC  Take 1 tablet (15 mg total) by mouth daily.     ondansetron 4 MG tablet  Commonly known as:  ZOFRAN  Take 1 tablet (4 mg total) by mouth every 6 (six) hours as needed for nausea.  senna 8.6 MG Tabs tablet  Commonly known as:  SENOKOT  Take 2 tablets (17.2 mg total) by mouth at bedtime.     TURMERIC PO  Take 1 tablet by mouth every morning.        Diagnostic Studies: Dg Pelvis Portable  08/16/2014   CLINICAL DATA:  Right hip replacement.  EXAM: PORTABLE PELVIS 1-2 VIEWS  COMPARISON:  None.  FINDINGS: The right hip total arthroplasty is noted on the single AP view. Mild degenerative changes are noted in the left hip. A Foley catheter is in place.  IMPRESSION: Right hip arthroplasty without radiographic evidence for complication on this single view.   Electronically Signed   By: Marin Roberts M.D.   On: 08/16/2014 11:51   Dg C-arm Gt 120 Min-no Report  08/16/2014   CLINICAL DATA: hip   C-ARM GT 120 MINUTE  Fluoroscopy was utilized by the requesting physician.  No radiographic  interpretation.    Dg Hip Operative  Unilat With Pelvis Right  08/16/2014   CLINICAL DATA:  Postop right total hip replacement  EXAM: OPERATIVE right HIP (WITH PELVIS IF PERFORMED) 2 VIEWS  TECHNIQUE: Fluoroscopic spot image(s) were submitted for interpretation post-operatively.  FLUOROSCOPY TIME:  Fluoroscopy Time:  1 minutes 2 seconds  Number of Acquired Images:  2  COMPARISON:  None.  FINDINGS: Two intraoperative views of the right hip submitted. There is right hip prosthesis with anatomic alignment.  IMPRESSION: Right hip prosthesis in anatomic alignment.   Electronically Signed   By: Natasha Mead M.D.   On: 08/16/2014 10:50    Disposition: Final discharge disposition not confirmed  Discharge Instructions    Call MD / Call 911    Complete by:  As directed   If you experience chest pain or shortness of breath, CALL 911 and be transported to the hospital emergency room.  If you develope a fever above 101 F, pus (white drainage) or increased drainage or redness at the wound, or calf pain, call your surgeon's office.     Constipation Prevention    Complete by:  As directed   Drink plenty of fluids.  Prune juice may be helpful.  You may use a stool softener, such as Colace (over the counter) 100 mg twice a day.  Use MiraLax (over the counter) for constipation as needed.     Diet - low sodium heart healthy    Complete by:  As directed      Driving restrictions    Complete by:  As directed   No driving for 6 weeks     Increase activity slowly as tolerated    Complete by:  As directed      Lifting restrictions    Complete by:  As directed   No lifting for 6 weeks     TED hose    Complete by:  As directed   Use stockings (TED hose) for 2 weeks on both leg(s).  You may remove them at night for sleeping.           Follow-up Information    Follow up with Magaby Rumberger, Cloyde Reams, MD. Schedule an appointment as soon as possible for a visit in 2 weeks.   Specialty:  Orthopedic Surgery   Why:  For wound re-check   Contact information:    [address] [place] [address] [phone]        Signed: Garnet Koyanagi 08/17/2014, 7:28 AM

## 2014-08-16 NOTE — H&P (View-Only) (Signed)
TOTAL HIP ADMISSION H&P  Patient is admitted for right total hip arthroplasty.  Subjective:  Chief Complaint: right hip pain  HPI: Darren Bean, 58 y.o. male, has a history of pain and functional disability in the right hip(s) due to arthritis and patient has failed non-surgical conservative treatments for greater than 12 weeks to include NSAID's and/or analgesics, flexibility and strengthening excercises, use of assistive devices, weight reduction as appropriate and activity modification.  Onset of symptoms was gradual starting 4 years ago with gradually worsening course since that time.The patient noted no past surgery on the right hip(s).  Patient currently rates pain in the right hip at 10 out of 10 with activity. Patient has night pain, worsening of pain with activity and weight bearing, pain that interfers with activities of daily living and pain with passive range of motion. Patient has evidence of subchondral cysts, subchondral sclerosis, periarticular osteophytes and joint space narrowing by imaging studies. This condition presents safety issues increasing the risk of falls. There is no current active infection.  There are no active problems to display for this patient.  Past Medical History  Diagnosis Date  . Seasonal allergies   . Arthritis   . GERD (gastroesophageal reflux disease)     takes tums as needed  . Hx of diabetes mellitus     taken off metformin after wt loss - glucose  became normal    Past Surgical History  Procedure Laterality Date  . Tonsillectomy    . Knee arthroscopy  1994    rt knee     (Not in a hospital admission) Allergies  Allergen Reactions  . Other     Refusal of blood- Jehova Witness.     History  Substance Use Topics  . Smoking status: Never Smoker   . Smokeless tobacco: Not on file  . Alcohol Use: Yes     Comment: social    No family history on file.   Review of Systems  Constitutional: Negative.   HENT: Negative.   Eyes: Negative.    Respiratory: Negative.   Cardiovascular: Negative.   Gastrointestinal: Negative.   Genitourinary: Negative.   Musculoskeletal: Positive for back pain and joint pain.  Skin: Negative.   Neurological: Negative.   Endo/Heme/Allergies: Negative.   Psychiatric/Behavioral: Negative.     Objective:  Physical Exam  Vitals reviewed. Constitutional: He is oriented to person, place, and time. He appears well-developed and well-nourished.  HENT:  Head: Normocephalic and atraumatic.  Eyes: Conjunctivae and EOM are normal. Pupils are equal, round, and reactive to light.  Neck: Normal range of motion. Neck supple.  Cardiovascular: Normal rate, regular rhythm, normal heart sounds and intact distal pulses.   Respiratory: Effort normal and breath sounds normal. No respiratory distress.  GI: Soft. Bowel sounds are normal. He exhibits no distension. There is no tenderness.  Genitourinary:  deferred  Musculoskeletal:       Right hip: He exhibits decreased range of motion.  Neurological: He is alert and oriented to person, place, and time. He has normal reflexes.  Skin: Skin is warm and dry.  Psychiatric: He has a normal mood and affect. His behavior is normal. Judgment and thought content normal.    Vital signs in last 24 hours: @VSRANGES@  Labs:   Estimated body mass index is 35.72 kg/(m^2) as calculated from the following:   Height as of 08/12/14: 5' 9" (1.753 m).   Weight as of 08/12/14: 109.77 kg (242 lb).   Imaging Review Plain radiographs demonstrate   severe degenerative joint disease of the right hip(s). The bone quality appears to be adequate for age and reported activity level.  Assessment/Plan:  End stage arthritis, right hip(s)  The patient history, physical examination, clinical judgement of the provider and imaging studies are consistent with end stage degenerative joint disease of the right hip(s) and total hip arthroplasty is deemed medically necessary. The treatment  options including medical management, injection therapy, arthroscopy and arthroplasty were discussed at length. The risks and benefits of total hip arthroplasty were presented and reviewed. The risks due to aseptic loosening, infection, stiffness, dislocation/subluxation,  thromboembolic complications and other imponderables were discussed.  The patient acknowledged the explanation, agreed to proceed with the plan and consent was signed. Patient is being admitted for inpatient treatment for surgery, pain control, PT, OT, prophylactic antibiotics, VTE prophylaxis, progressive ambulation and ADL's and discharge planning.The patient is planning to be discharged home with home health services. Jehovah's witness - no blood products. 

## 2014-08-17 LAB — CBC
HCT: 34.8 % — ABNORMAL LOW (ref 39.0–52.0)
Hemoglobin: 12 g/dL — ABNORMAL LOW (ref 13.0–17.0)
MCH: 29.2 pg (ref 26.0–34.0)
MCHC: 34.5 g/dL (ref 30.0–36.0)
MCV: 84.7 fL (ref 78.0–100.0)
PLATELETS: 215 10*3/uL (ref 150–400)
RBC: 4.11 MIL/uL — ABNORMAL LOW (ref 4.22–5.81)
RDW: 12.5 % (ref 11.5–15.5)
WBC: 13.6 10*3/uL — ABNORMAL HIGH (ref 4.0–10.5)

## 2014-08-17 LAB — BASIC METABOLIC PANEL
Anion gap: 7 (ref 5–15)
BUN: 16 mg/dL (ref 6–20)
CO2: 28 mmol/L (ref 22–32)
CREATININE: 1.06 mg/dL (ref 0.61–1.24)
Calcium: 8.1 mg/dL — ABNORMAL LOW (ref 8.9–10.3)
Chloride: 100 mmol/L — ABNORMAL LOW (ref 101–111)
GFR calc Af Amer: >60 mL/min (ref >60)
GFR calc non Af Amer: >60 mL/min (ref >60)
Glucose, Bld: 236 mg/dL — ABNORMAL HIGH (ref 65–99)
Potassium: 4 mmol/L (ref 3.5–5.1)
SODIUM: 135 mmol/L (ref 135–145)

## 2014-08-17 MED ORDER — SENNA 8.6 MG PO TABS: 2.0000 | ORAL_TABLET | Freq: Every day | ORAL | Status: DC

## 2014-08-17 MED ORDER — ONDANSETRON HCL 4 MG PO TABS: 4.0000 mg | ORAL_TABLET | Freq: Four times a day (QID) | ORAL | Status: DC | PRN

## 2014-08-17 MED ORDER — ASPIRIN 325 MG PO TBEC: 325.0000 mg | DELAYED_RELEASE_TABLET | Freq: Two times a day (BID) | ORAL | Status: DC

## 2014-08-17 MED ORDER — MELOXICAM 15 MG PO TABS: 15.0000 mg | ORAL_TABLET | Freq: Every day | ORAL | Status: DC

## 2014-08-17 MED ORDER — DOCUSATE SODIUM 100 MG PO CAPS: 100.0000 mg | ORAL_CAPSULE | Freq: Two times a day (BID) | ORAL | Status: DC

## 2014-08-17 MED ORDER — HYDROCODONE-ACETAMINOPHEN 5-325 MG PO TABS: 1.0000 | ORAL_TABLET | ORAL | Status: DC | PRN

## 2014-08-17 NOTE — Evaluation (Signed)
Physical Therapy Evaluation Patient Details Name: Darren Bean MRN: [id number] DOB: 1956/01/05 Today's Date: 08/17/2014   History of Present Illness  59 yo male s/p R THA-direct anterior 08/16/14  Clinical Impression  On eval, pt was Min guard-Min assist for mobility-able to ambulate ~150 feet with RW. Pt tolerated session well. Will plan to have 2nd session this pm to practice steps prior to d/c.     Follow Up Recommendations Home health PT    Equipment Recommendations  None recommended by PT (pt states he has borrowed a walker already)    Recommendations for Other Services       Precautions / Restrictions Precautions Precautions: None Restrictions Weight Bearing Restrictions: No RLE Weight Bearing: Weight bearing as tolerated      Mobility  Bed Mobility Overal bed mobility: Needs Assistance Bed Mobility: Supine to Sit     Supine to sit: Min assist     General bed mobility comments: Assist for R LE  Transfers Overall transfer level: Needs assistance Equipment used: Rolling walker (2 wheeled) Transfers: Sit to/from Stand Sit to Stand: Min guard         General transfer comment: close guard for safety. VCs safety, hand/ R LE placement  Ambulation/Gait Ambulation/Gait assistance: Min guard Ambulation Distance (Feet): 150 Feet Assistive device: Rolling walker (2 wheeled) Gait Pattern/deviations: Step-through pattern;Decreased stride length     General Gait Details: close guard for safety. VCs safety, sequence. Pt progressed to step through pattern as distance increased. A few standing rest breaks taken during walk due to mild lightheadedness.   Stairs            Wheelchair Mobility    Modified Rankin (Stroke Patients Only)       Balance                                             Pertinent Vitals/Pain Pain Assessment: 0-10 Pain Score: 3  Pain Location: R hip with activity Pain Descriptors / Indicators: Sore Pain  Intervention(s): Monitored during session;Ice applied;Repositioned    Home Living Family/patient expects to be discharged to:: Private residence Living Arrangements: Spouse/significant other;Children   Type of Home:  (condo) Home Access: Stairs to enter Entrance Stairs-Rails: Right Entrance Stairs-Number of Steps: 1 flight Home Layout: One level Home Equipment: Environmental consultant - 2 wheels      Prior Function Level of Independence: Independent               Hand Dominance        Extremity/Trunk Assessment   Upper Extremity Assessment: Defer to OT evaluation           Lower Extremity Assessment: RLE deficits/detail RLE Deficits / Details: at least: hip flex 2/5, hip abd/add 2/5. moves ankle well    Cervical / Trunk Assessment: Normal  Communication   Communication: No difficulties  Cognition Arousal/Alertness: Awake/alert Behavior During Therapy: WFL for tasks assessed/performed Overall Cognitive Status: Within Functional Limits for tasks assessed                      General Comments      Exercises Total Joint Exercises Ankle Circles/Pumps: AROM;Both;15 reps;Supine Quad Sets: AROM;Both;15 reps;Supine Heel Slides: AAROM;Right;15 reps;Supine Hip ABduction/ADduction: AAROM;Right;15 reps;Supine      Assessment/Plan    PT Assessment Patient needs continued PT services  PT Diagnosis Difficulty walking;Acute pain  PT Problem List Decreased strength;Decreased range of motion;Decreased activity tolerance;Decreased balance;Decreased mobility;Decreased knowledge of use of DME;Pain  PT Treatment Interventions DME instruction;Gait training;Functional mobility training;Therapeutic activities;Therapeutic exercise;Patient/family education;Balance training   PT Goals (Current goals can be found in the Care Plan section) Acute Rehab PT Goals Patient Stated Goal: home today PT Goal Formulation: With patient Time For Goal Achievement: 08/24/14 Potential to Achieve  Goals: Good    Frequency 7X/week   Barriers to discharge        Co-evaluation               End of Session Equipment Utilized During Treatment: Gait belt Activity Tolerance: Patient tolerated treatment well Patient left: in chair;with call bell/phone within reach           Time: 0905-0933 PT Time Calculation (min) (ACUTE ONLY): 28 min   Charges:   PT Evaluation $Initial PT Evaluation Tier I: 1 Procedure PT Treatments $Gait Training: 8-22 mins   PT G Codes:        [name] Alert, MPT Pager: [phone]

## 2014-08-17 NOTE — Progress Notes (Signed)
   Subjective:  Patient reports pain as mild.  No c/o. Denies N/V/CP/SOB.  Objective:   VITALS:   Filed Vitals:   08/16/14 1828 08/16/14 2234 08/17/14 0324 08/17/14 0600  BP: 125/79 117/70 117/76 126/80  Pulse: 96 97 94 92  Temp: 97.8 F (36.6 C) 97.8 F (36.6 C) 97.8 F (36.6 C) 96.9 F (36.1 C)  TempSrc: Oral Oral Oral Axillary  Resp: 14 16 18 18   Height:      Weight:      SpO2: 97% 92% 96% 96%    ABD soft Sensation intact distally Intact pulses distally Dorsiflexion/Plantar flexion intact Incision: dressing C/D/I Compartment soft   Lab Results  Component Value Date   WBC 13.6* 08/17/2014   HGB 12.0* 08/17/2014   HCT 34.8* 08/17/2014   MCV 84.7 08/17/2014   PLT 215 08/17/2014   BMET    Component Value Date/Time   NA 135 08/17/2014 0435   K 4.0 08/17/2014 0435   CL 100* 08/17/2014 0435   CO2 28 08/17/2014 0435   GLUCOSE 236* 08/17/2014 0435   BUN 16 08/17/2014 0435   CREATININE 1.06 08/17/2014 0435   CALCIUM 8.1* 08/17/2014 0435   GFRNONAA >60 08/17/2014 0435   GFRAA >60 08/17/2014 0435     Assessment/Plan: 1 Day Post-Op   Principal Problem:   Primary osteoarthritis of right hip   WBAT with walker PO pain control ASA, SCDs, TEDs PT/OT D/c home after clears PT    Cambria Osten, Cloyde Reams 08/17/2014, 7:22 AM   Samson Frederic, MD Cell [phone]

## 2014-08-17 NOTE — Evaluation (Signed)
Occupational Therapy Evaluation Patient Details Name: Darren Bean MRN: [id number] DOB: Aug 08, 1955 Today's Date: 08/17/2014    History of Present Illness 59 yo male s/p R THA-direct anterior 08/16/14   Clinical Impression   OT education completed regarding ADL activity s/p anterior THA.    Follow Up Recommendations  No OT follow up    Equipment Recommendations  None recommended by OT    Recommendations for Other Services       Precautions / Restrictions Precautions Precautions: None Restrictions Weight Bearing Restrictions: No RLE Weight Bearing: Weight bearing as tolerated      Mobility Bed Mobility Overal bed mobility: Needs Assistance Bed Mobility: Supine to Sit     Supine to sit: Min assist     General bed mobility comments: pt inchair  Transfers Overall transfer level: Needs assistance Equipment used: Rolling walker (2 wheeled) Transfers: Sit to/from [employer] Sit to Stand: Min guard Stand pivot transfers: Min guard       General transfer comment: close guard for safety. VCs safety, hand/ R LE placement         ADL Overall ADL's : Needs assistance/impaired                                       General ADL Comments: pt overall s- min A with ADL actiivity. Pt needed able to don pants but did need min A with socks.  OT education complete.               Pertinent Vitals/Pain Pain Assessment: 0-10 Pain Score: 3  Pain Location: R hip with activity Pain Descriptors / Indicators: Sore Pain Intervention(s): Monitored during session;Ice applied;Repositioned     Hand Dominance     Extremity/Trunk Assessment Upper Extremity Assessment Upper Extremity Assessment: Defer to OT evaluation   Lower Extremity Assessment Lower Extremity Assessment: RLE deficits/detail RLE Deficits / Details: at least: hip flex 2/5, hip abd/add 2/5. moves ankle well   Cervical / Trunk Assessment Cervical / Trunk Assessment:  Normal   Communication Communication Communication: No difficulties   Cognition Arousal/Alertness: Awake/alert Behavior During Therapy: WFL for tasks assessed/performed Overall Cognitive Status: Within Functional Limits for tasks assessed                                Home Living Family/patient expects to be discharged to:: Private residence Living Arrangements: Spouse/significant other;Children   Type of Home:  (condo) Home Access: Stairs to enter Secretary/administrator of Steps: 1 flight Entrance Stairs-Rails: Right Home Layout: One level     Bathroom Shower/Tub: Producer, television/film/video: Standard     Home Equipment: Environmental consultant - 2 wheels          Prior Functioning/Environment Level of Independence: Independent                      OT Goals(Current goals can be found in the care plan section) Acute Rehab OT Goals Patient Stated Goal: home today  OT Frequency:                End of Session Nurse Communication: Mobility status  Activity Tolerance: Patient tolerated treatment well Patient left: in chair;with call bell/phone within reach   Time: 5027-7412 OT Time Calculation (min): 18 min Charges:  OT General Charges $OT Visit: 1 Procedure OT Evaluation $  Initial OT Evaluation Tier I: 1 Procedure G-Codes:    Alba Cory 2014/08/27, 12:05 PM

## 2014-08-17 NOTE — Progress Notes (Signed)
Discharged from floor via w/c, belongings & wife with pt. No changes in assessment. Darren Bean  

## 2014-08-17 NOTE — Care Management Note (Signed)
Case Management Note  Patient Details  Name: AMI KUMAGAI MRN: [id number] Date of Birth: [birth year]  Subjective/Objective:  Hip fracture w surgical repair          Action/Plan:  D/c home with home health PT Expected Discharge Date:   08/17/14               Expected Discharge Plan:  Home w Home Health Services  In-House Referral:     Discharge planning Services  CM Consult  Post Acute Care Choice:    Choice offered to:  Patient  DME Arranged:    DME Agency:     HH Arranged:  PT HH Agency:  [facility]  Status of Service:  Completed, signed off  Medicare Important Message Given:    Date Medicare IM Given:    Medicare IM give by:    Date Additional Medicare IM Given:    Additional Medicare Important Message give by:     If discussed at Long Length of Stay Meetings, dates discussed:    Additional Comments: Patient and wife in room, choice list offered, patient chose [facility] contacted rep, HHPT set up. Patient has all equipment.  No other needs at this time.   Barnett Applebaum, RN 08/17/2014, 11:24 AM

## 2014-08-17 NOTE — Plan of Care (Signed)
Problem: Discharge Progression Outcomes Goal: Anticoagulant follow-up in place Outcome: Not Applicable Date Met:  59/56/38 asa

## 2014-08-17 NOTE — Progress Notes (Signed)
Physical Therapy Treatment Patient Details Name: Darren Bean MRN: [id number] DOB: 07-Nov-1955 Today's Date: 08/17/2014    History of Present Illness 59 yo male s/p R THA-direct anterior 08/16/14    PT Comments    Progressing well with mobility. All education completed.   Follow Up Recommendations  Home health PT     Equipment Recommendations  None recommended by PT    Recommendations for Other Services       Precautions / Restrictions Precautions Precautions: None Restrictions Weight Bearing Restrictions: No RLE Weight Bearing: Weight bearing as tolerated    Mobility  Bed Mobility Overal bed mobility: Needs Assistance Bed Mobility: Supine to Sit     Supine to sit: Min assist     General bed mobility comments: pt oob coming out of bathroom  Transfers Overall transfer level: Needs assistance Equipment used: Rolling walker (2 wheeled) Transfers: Sit to/from [employer] Sit to Stand: Min guard Stand pivot transfers: Min guard       General transfer comment: close guard for safety. VCs safety, hand/ R LE placement  Ambulation/Gait Ambulation/Gait assistance: Min guard Ambulation Distance (Feet): 200 Feet Assistive device: Rolling walker (2 wheeled) Gait Pattern/deviations: Step-through pattern     General Gait Details: close guard for safety. VCs safety, sequence.    Stairs Stairs: Yes Stairs assistance: Min guard Stair Management: One rail Left;Step to pattern;Forwards Number of Stairs: 9 General stair comments: close guard for safety. VCs safety, technique, son present to observe as well.   Wheelchair Mobility    Modified Rankin (Stroke Patients Only)       Balance                                    Cognition Arousal/Alertness: Awake/alert Behavior During Therapy: WFL for tasks assessed/performed Overall Cognitive Status: Within Functional Limits for tasks assessed                      Exercises      General Comments        Pertinent Vitals/Pain Pain Assessment: 0-10 Pain Score: 3  Pain Location: R hip with activity Pain Descriptors / Indicators: Sore Pain Intervention(s): Monitored during session    Home Living                      Prior Function            PT Goals (current goals can now be found in the care plan section) Acute Rehab PT Goals Patient Stated Goal: home today PT Goal Formulation: With patient Time For Goal Achievement: 08/24/14 Potential to Achieve Goals: Good Progress towards PT goals: Progressing toward goals    Frequency  7X/week    PT Plan Current plan remains appropriate    Co-evaluation             End of Session Equipment Utilized During Treatment: Gait belt Activity Tolerance: Patient tolerated treatment well Patient left: with call bell/phone within reach;with family/visitor present     Time: 7737-3668 PT Time Calculation (min) (ACUTE ONLY): 9 min  Charges:  $Gait Training: 8-22 mins                    G Codes:      [name] Alert, MPT Pager: [phone]

## 2014-08-17 NOTE — Clinical Social Work Note (Signed)
CSW reviewed PT evaluation dated 08/17/14 that reflected recommendations of HH/PT.  No CSW needs  .Elray Buba, LCSW [facility] Clinical Social Worker - Weekend Coverage cell #: [phone]

## 2014-08-19 ENCOUNTER — Encounter (HOSPITAL_COMMUNITY): Payer: Self-pay | Admitting: Orthopedic Surgery

## 2014-11-12 ENCOUNTER — Encounter (HOSPITAL_COMMUNITY): Payer: Self-pay

## 2014-11-12 ENCOUNTER — Emergency Department (INDEPENDENT_AMBULATORY_CARE_PROVIDER_SITE_OTHER)
Admission: EM | Admit: 2014-11-12 | Discharge: 2014-11-12 | Disposition: A | Discharge status: Home / Self Care | Payer: BC Managed Care – PPO | Source: Home / Self Care | Attending: Family Medicine | Admitting: Family Medicine

## 2014-11-12 DIAGNOSIS — J029 Acute pharyngitis, unspecified: Secondary | ICD-10-CM | POA: Diagnosis not present

## 2014-11-12 DIAGNOSIS — J209 Acute bronchitis, unspecified: Secondary | ICD-10-CM

## 2014-11-12 LAB — POCT RAPID STREP A: Streptococcus, Group A Screen (Direct): NEGATIVE

## 2014-11-12 MED ORDER — HYDROCODONE-HOMATROPINE 5-1.5 MG/5ML PO SYRP: 5.0000 mL | ORAL_SOLUTION | Freq: Four times a day (QID) | ORAL | Status: DC | PRN

## 2014-11-12 MED ORDER — AZITHROMYCIN 250 MG PO TABS: ORAL_TABLET | ORAL | Status: DC

## 2014-11-12 NOTE — Discharge Instructions (Signed)
Your strep test is negative. I'm prescribing an antibiotic that should help both the throat and the bronchitis.  Acute Bronchitis Bronchitis is inflammation of the airways that extend from the windpipe into the lungs (bronchi). The inflammation often causes mucus to develop. This leads to a cough, which is the most common symptom of bronchitis.  In acute bronchitis, the condition usually develops suddenly and goes away over time, usually in a couple weeks. Smoking, allergies, and asthma can make bronchitis worse. Repeated episodes of bronchitis may cause further lung problems.  CAUSES Acute bronchitis is most often caused by the same virus that causes a cold. The virus can spread from person to person (contagious) through coughing, sneezing, and touching contaminated objects. SIGNS AND SYMPTOMS   Cough.   Fever.   Coughing up mucus.   Body aches.   Chest congestion.   Chills.   Shortness of breath.   Sore throat.  DIAGNOSIS  Acute bronchitis is usually diagnosed through a physical exam. Your health care provider will also ask you questions about your medical history. Tests, such as chest X-rays, are sometimes done to rule out other conditions.  TREATMENT  Acute bronchitis usually goes away in a couple weeks. Oftentimes, no medical treatment is necessary. Medicines are sometimes given for relief of fever or cough. Antibiotic medicines are usually not needed but may be prescribed in certain situations. In some cases, an inhaler may be recommended to help reduce shortness of breath and control the cough. A cool mist vaporizer may also be used to help thin bronchial secretions and make it easier to clear the chest.  HOME CARE INSTRUCTIONS  Get plenty of rest.   Drink enough fluids to keep your urine clear or pale yellow (unless you have a medical condition that requires fluid restriction). Increasing fluids may help thin your respiratory secretions (sputum) and reduce chest  congestion, and it will prevent dehydration.   Take medicines only as directed by your health care provider.  If you were prescribed an antibiotic medicine, finish it all even if you start to feel better.  Avoid smoking and secondhand smoke. Exposure to cigarette smoke or irritating chemicals will make bronchitis worse. If you are a smoker, consider using nicotine gum or skin patches to help control withdrawal symptoms. Quitting smoking will help your lungs heal faster.   Reduce the chances of another bout of acute bronchitis by washing your hands frequently, avoiding people with cold symptoms, and trying not to touch your hands to your mouth, nose, or eyes.   Keep all follow-up visits as directed by your health care provider.  SEEK MEDICAL CARE IF: Your symptoms do not improve after 1 week of treatment.  SEEK IMMEDIATE MEDICAL CARE IF:  You develop an increased fever or chills.   You have chest pain.   You have severe shortness of breath.  You have bloody sputum.   You develop dehydration.  You faint or repeatedly feel like you are going to pass out.  You develop repeated vomiting.  You develop a severe headache. MAKE SURE YOU:   Understand these instructions.  Will watch your condition.  Will get help right away if you are not doing well or get worse. Document Released: 03/18/2004 Document Revised: 06/25/2013 Document Reviewed: 08/01/2012 [facility] Patient Information 2015 [place], [place]. This information is not intended to replace advice given to you by your health care provider. Make sure you discuss any questions you have with your health care provider.    Your strep  test is negative. I'm prescribing an antibiotic that should help both the throat and the bronchitis.

## 2014-11-12 NOTE — ED Notes (Signed)
Patient complains of cough congestion and increased mucous Patient  States he has been coughing up greenish phlegm

## 2014-11-12 NOTE — ED Provider Notes (Addendum)
CSN: [id number]     Arrival date & time 11/12/14  1826 History   First MD Initiated Contact with Patient 11/12/14 2006     Chief Complaint  Patient presents with  . Cough   (Consider location/radiation/quality/duration/timing/severity/associated sxs/prior Treatment) Patient is a 59 y.o. male presenting with cough. The history is provided by the patient.  Cough Cough characteristics:  Harsh Severity:  Severe Onset quality:  Gradual Duration:  2 days Timing:  Constant Progression:  Worsening Chronicity:  New Smoker: no   Relieved by:  Cough suppressants Associated symptoms: chills, diaphoresis, rhinorrhea and sore throat   Associated symptoms: no chest pain    patient teaches high school. He's been taking NyQuil which is helped the first half of the night. The cough seems to come back around 3 AM. Yesterday was coughing so hard that he was retching.  Past Medical History  Diagnosis Date  . Seasonal allergies   . Arthritis   . GERD (gastroesophageal reflux disease)     takes tums as needed  . Hx of diabetes mellitus     taken off metformin after wt loss - glucose  became normal   Past Surgical History  Procedure Laterality Date  . Tonsillectomy    . Knee arthroscopy  1994    rt knee  . 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 Social History  Substance Use Topics  . Smoking status: Never Smoker   . Smokeless tobacco: None  . Alcohol Use: Yes     Comment: social    Review of Systems  Constitutional: Positive for chills and diaphoresis.  HENT: Positive for rhinorrhea and sore throat.   Respiratory: Positive for cough.   Cardiovascular: Negative for chest pain.  Gastrointestinal: Positive for vomiting.  Musculoskeletal: Positive for arthralgias.  Skin: Negative.     Allergies   Other  Home Medications   Prior to Admission medications   Medication Sig Start Date End Date Taking? Authorizing Provider  aspirin EC 325 MG EC tablet Take 1 tablet (325 mg total) by mouth 2 (two) times daily after a meal. 08/17/14   Samson Frederic, MD  calcium carbonate (TUMS - DOSED IN MG ELEMENTAL CALCIUM) 500 MG chewable tablet Chew 2 tablets by mouth 2 (two) times daily as needed for indigestion or heartburn.    Historical Provider, MD  docusate sodium (COLACE) 100 MG capsule Take 1 capsule (100 mg total) by mouth 2 (two) times daily. 08/17/14   Samson Frederic, MD  HYDROcodone-acetaminophen (NORCO/VICODIN) 5-325 MG per tablet Take 1-2 tablets by mouth every 4 (four) hours as needed (breakthrough pain). 08/17/14   Samson Frederic, MD  meloxicam (MOBIC) 15 MG tablet Take 1 tablet (15 mg total) by mouth daily. 08/17/14   Samson Frederic, MD  ondansetron (ZOFRAN) 4 MG tablet Take 1 tablet (4 mg total) by mouth every 6 (six) hours as needed for nausea. 08/17/14   Samson Frederic, MD  senna (SENOKOT) 8.6 MG TABS tablet Take 2 tablets (17.2 mg total) by mouth at bedtime. 08/17/14   Samson Frederic, MD  TURMERIC PO Take 1 tablet by mouth every morning.    Historical Provider, MD   Meds Ordered and Administered this Visit  Medications - No data to display  BP 148/95 mmHg  Pulse 102  Temp(Src) 99.9 F (37.7 C) (Oral)  Resp 17  SpO2 97% No data found.   Physical Exam  Constitutional: He is oriented to person, place, and time. He appears well-developed and well-nourished.  HENT:  Head: Normocephalic and atraumatic.  Right Ear: External ear normal.  Left Ear: External ear normal.  Erythematous throat diffusely  Neck: Normal range of motion. Neck supple. No thyromegaly present.  Cardiovascular: Normal rate, regular rhythm, normal heart sounds and intact distal pulses.   Pulmonary/Chest: Effort normal. He has wheezes.  Musculoskeletal: Normal range of motion.  Lymphadenopathy:    He has no  cervical adenopathy.  Neurological: He is alert and oriented to person, place, and time.  Skin: Skin is warm and dry.  Psychiatric: He has a normal mood and affect. His behavior is normal. Judgment and thought content normal.  Nursing note and vitals reviewed.   ED Course  Procedures (including critical care time)  Labs Review Results for orders placed or performed during the hospital encounter of 08/16/14  CBC  Result Value Ref Range   WBC 13.6 (H) 4.0 - 10.5 K/uL   RBC 4.11 (L) 4.22 - 5.81 MIL/uL   Hemoglobin 12.0 (L) 13.0 - 17.0 g/dL   HCT 40.9 (L) 81.1 - 91.4 %   MCV 84.7 78.0 - 100.0 fL   MCH 29.2 26.0 - 34.0 pg   MCHC 34.5 30.0 - 36.0 g/dL   RDW 78.2 95.6 - 21.3 %   Platelets 215 150 - 400 K/uL  Basic metabolic panel  Result Value Ref Range   Sodium 135 135 - 145 mmol/L   Potassium 4.0 3.5 - 5.1 mmol/L   Chloride 100 (L) 101 - 111 mmol/L   CO2 28 22 - 32 mmol/L   Glucose, Bld 236 (H) 65 - 99 mg/dL   BUN 16 6 - 20 mg/dL   Creatinine, Ser 0.86 0.61 - 1.24 mg/dL   Calcium 8.1 (L) 8.9 - 10.3 mg/dL   GFR calc non Af Amer >60 >60 mL/min   GFR calc Af Amer >60 >60 mL/min   Anion gap 7 5 - 15     Results for orders placed or performed during the hospital encounter of 11/12/14  POCT rapid strep A [facility])  Result Value Ref Range   Streptococcus, Group A Screen (Direct) NEGATIVE NEGATIVE    MDM  Bronchitis, acute with pharyngitis  Plan: Z-Pak, Hydromet  Signed, Elvina Sidle M.D.    Elvina Sidle, MD 11/12/14 2050  Elvina Sidle, MD 11/12/14 2050

## 2014-11-15 LAB — CULTURE, GROUP A STREP: Strep A Culture: NEGATIVE

## 2014-11-15 NOTE — ED Notes (Signed)
Final report of strep testing negative ; patient advised

## 2015-07-08 IMAGING — RF DG HIP (WITH PELVIS) OPERATIVE*R*
1 series · 2 of 2 positions shown · non-contrast
Comparison: None.

CLINICAL DATA: Postop right total hip replacement

EXAM:
OPERATIVE right HIP (WITH PELVIS IF PERFORMED) 2 VIEWS
TECHNIQUE: Fluoroscopic spot image(s) were submitted for interpretation
post-operatively.
FLUOROSCOPY TIME:  Fluoroscopy Time:  1 minutes 2 seconds
Number of Acquired Images:  2

[Series 1: run · 2 of 2 slices shown]
[im 1/2]
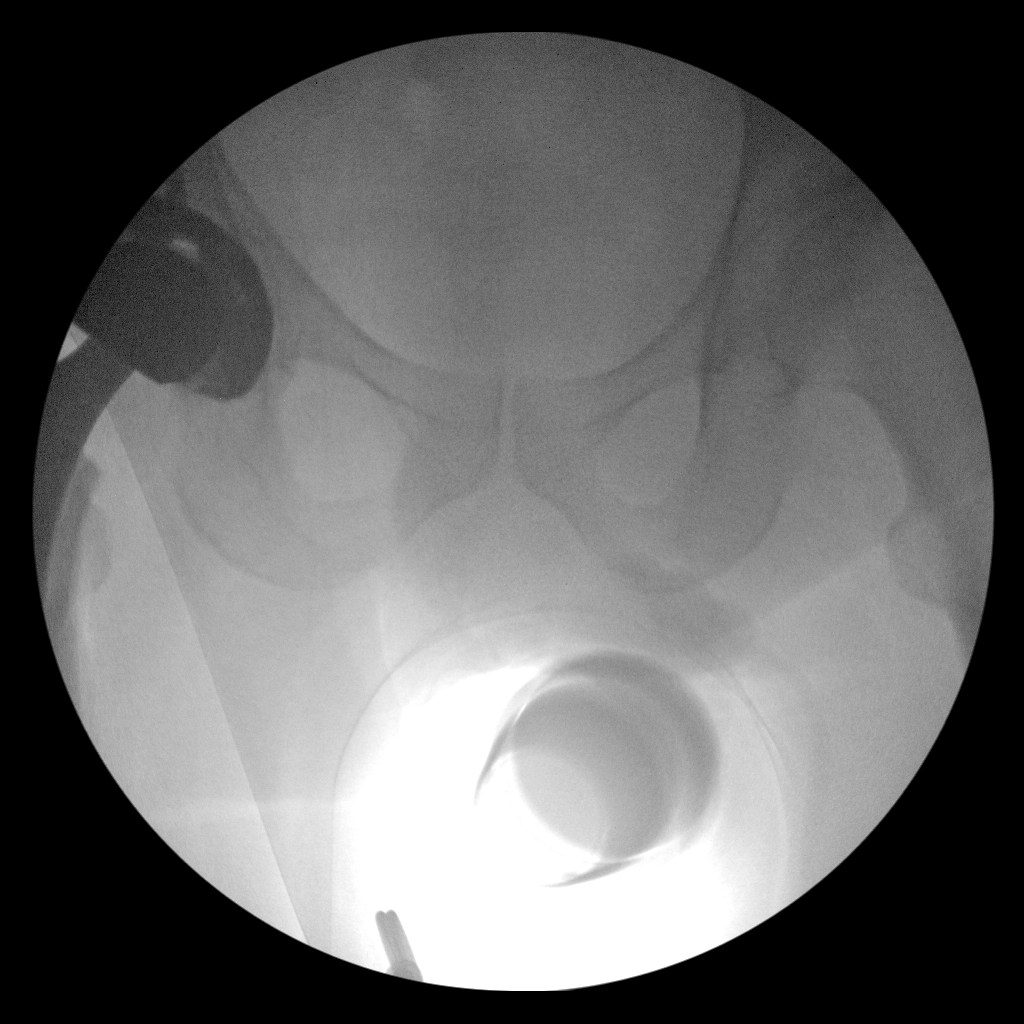
[im 2/2]
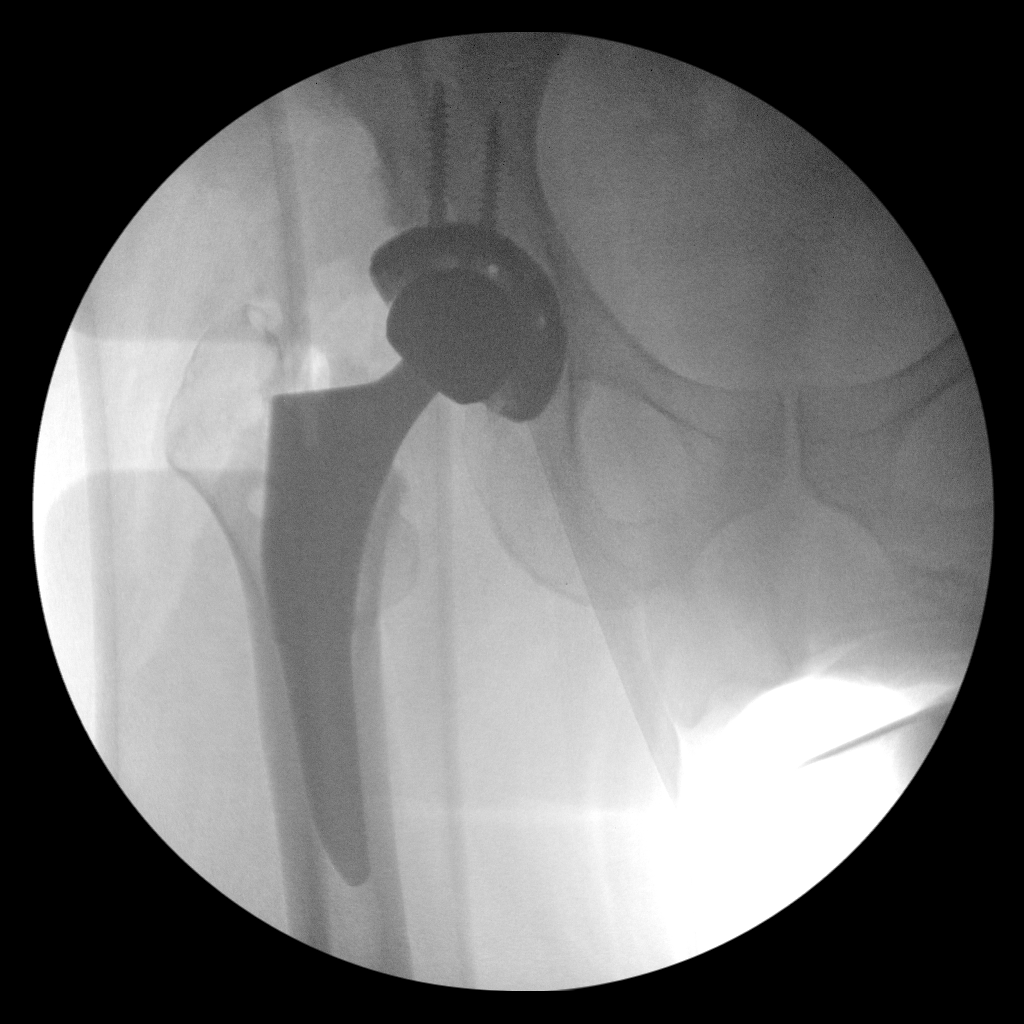

[2 of 2 positions shown; findings below may reference images not displayed]

FINDINGS: Two intraoperative views of the right hip submitted. There is right
hip prosthesis with anatomic alignment.
IMPRESSION: Right hip prosthesis in anatomic alignment.

## 2019-08-29 ENCOUNTER — Other Ambulatory Visit: Payer: Self-pay | Admitting: Nephrology

## 2019-08-29 DIAGNOSIS — E1121 Type 2 diabetes mellitus with diabetic nephropathy: Secondary | ICD-10-CM

## 2019-09-05 ENCOUNTER — Ambulatory Visit
Admission: RE | Admit: 2019-09-05 | Discharge: 2019-09-05 | Disposition: A | Discharge status: Home / Self Care | Payer: BC Managed Care – PPO | Source: Ambulatory Visit | Attending: Nephrology | Admitting: Nephrology

## 2019-09-05 ENCOUNTER — Other Ambulatory Visit: Payer: Self-pay

## 2019-09-05 DIAGNOSIS — E1121 Type 2 diabetes mellitus with diabetic nephropathy: Secondary | ICD-10-CM | POA: Diagnosis present

## 2020-07-27 IMAGING — US US RENAL
1 series · 14 of 22 positions shown · non-contrast
Comparison: None.

CLINICAL DATA: Diabetic nephropathy with proteinuria

EXAM:
RENAL / URINARY TRACT ULTRASOUND COMPLETE

[Series 1: us renal · 0.26mm/px · 14 of 22 slices shown]
[im 1/22]
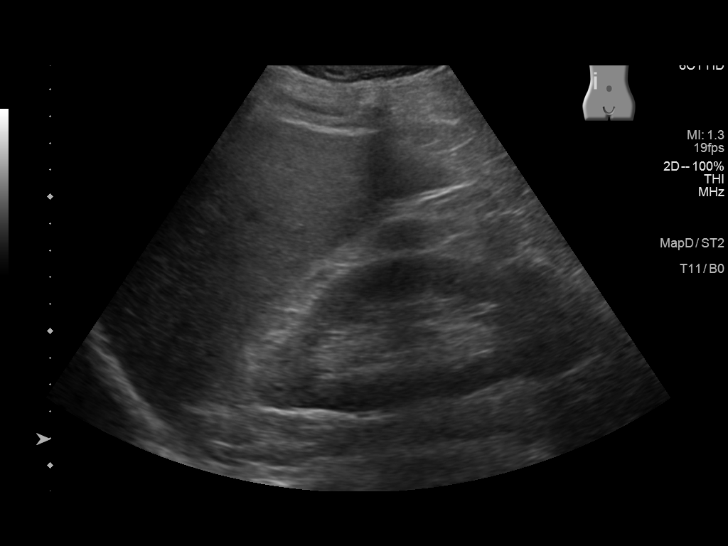
[im 3/22]
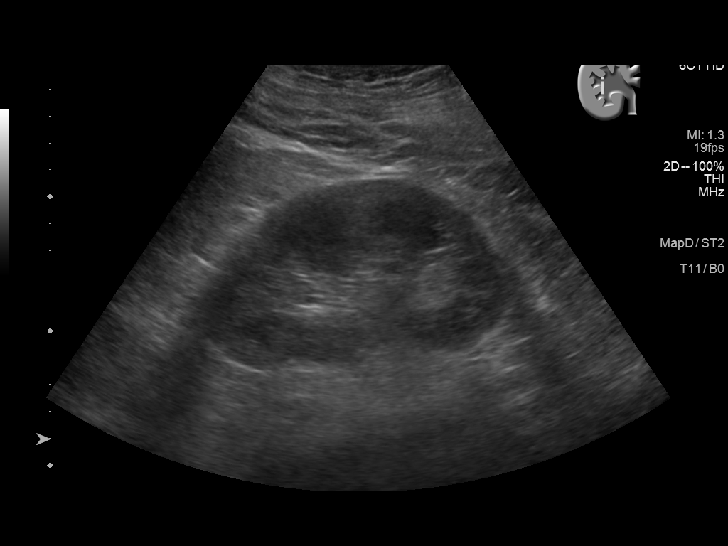
[im 4/22]
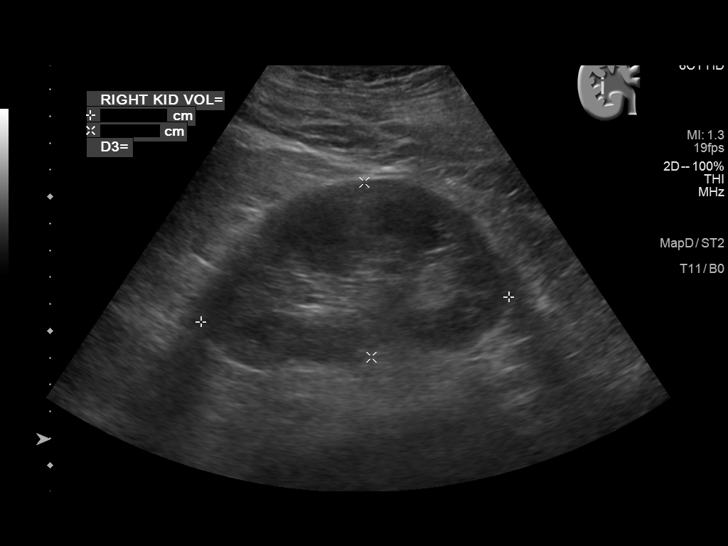
[im 6/22]
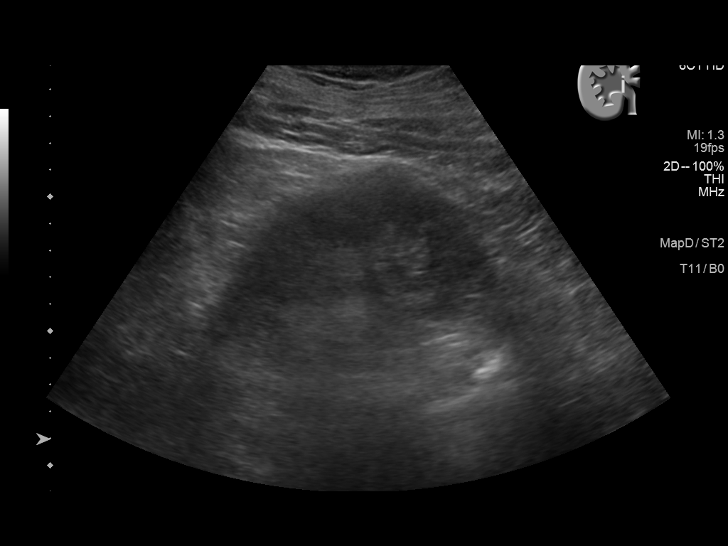
[im 8/22]
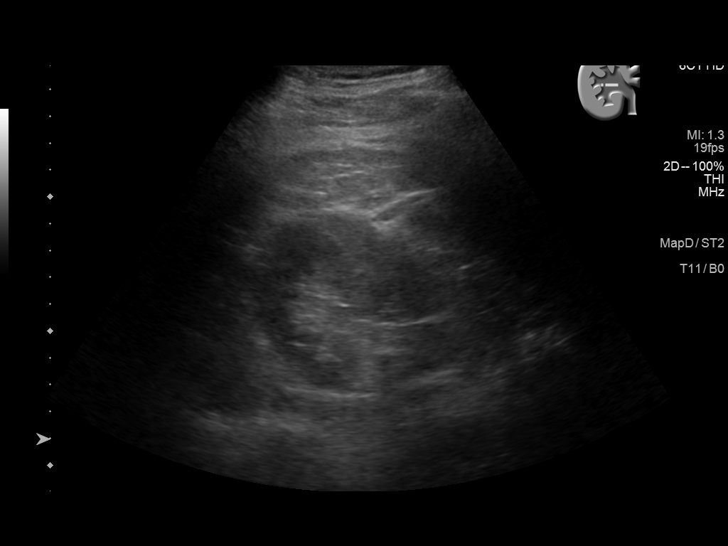
[im 9/22]
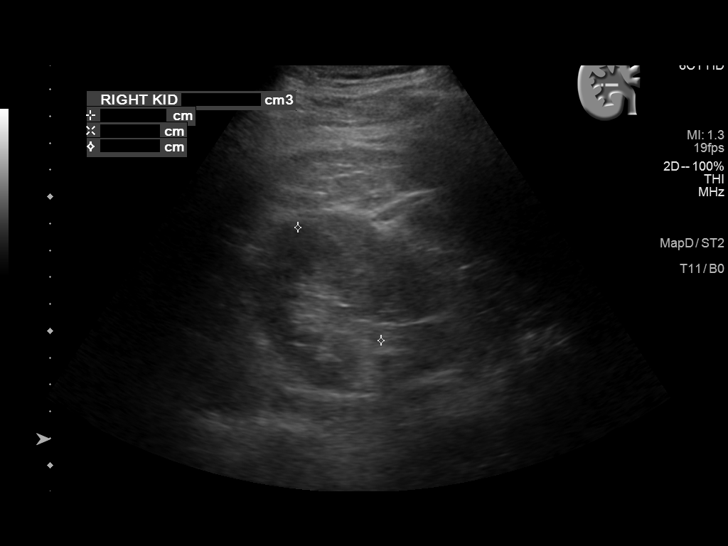
[im 11/22]
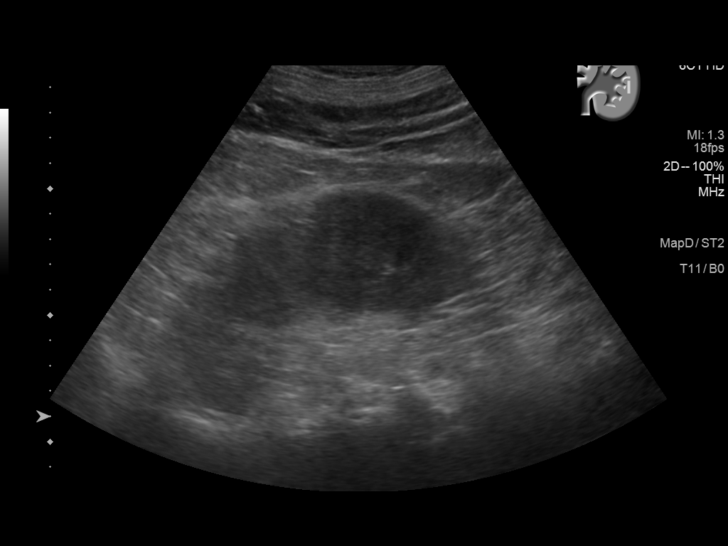
[im 12/22]
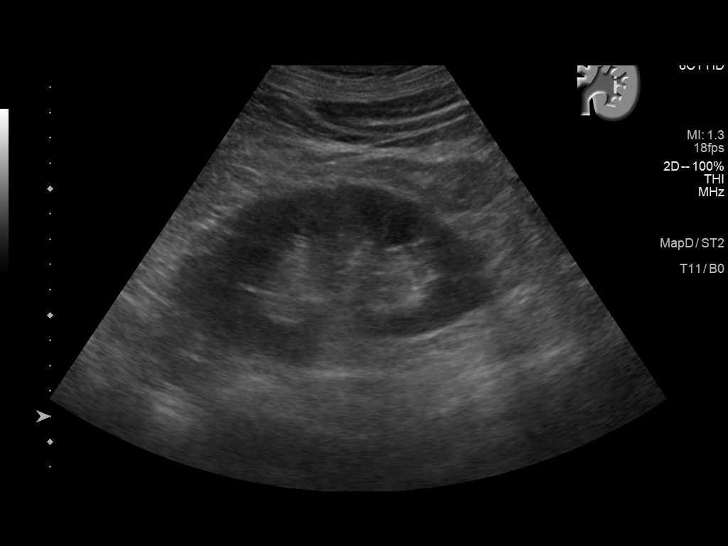
[im 14/22]
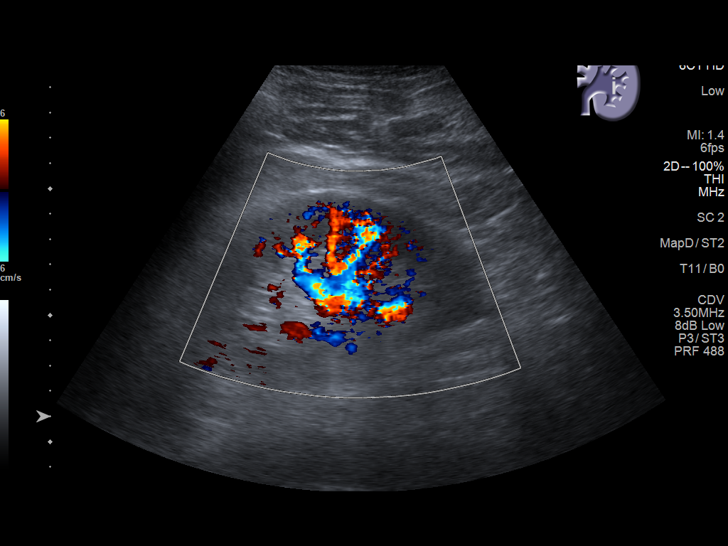
[im 15/22]
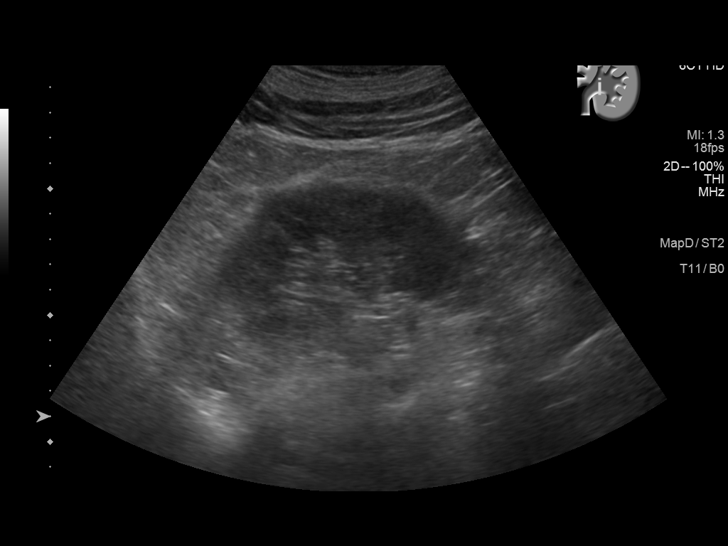
[im 17/22]
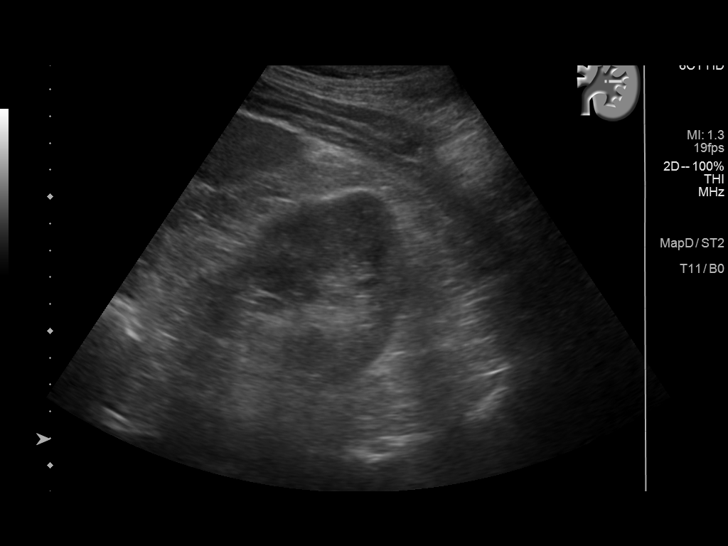
[im 19/22]
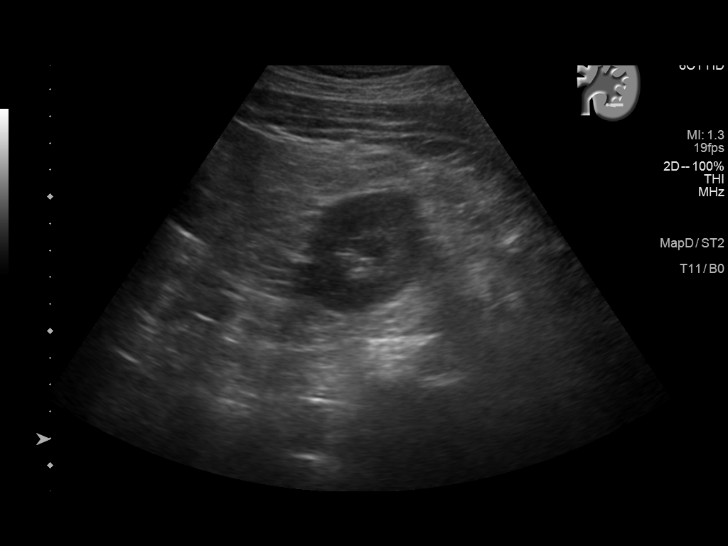
[im 20/22]
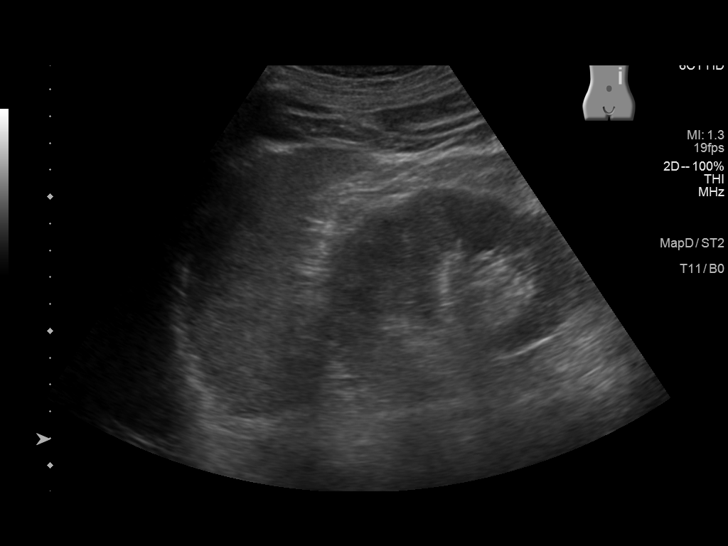
[im 22/22]
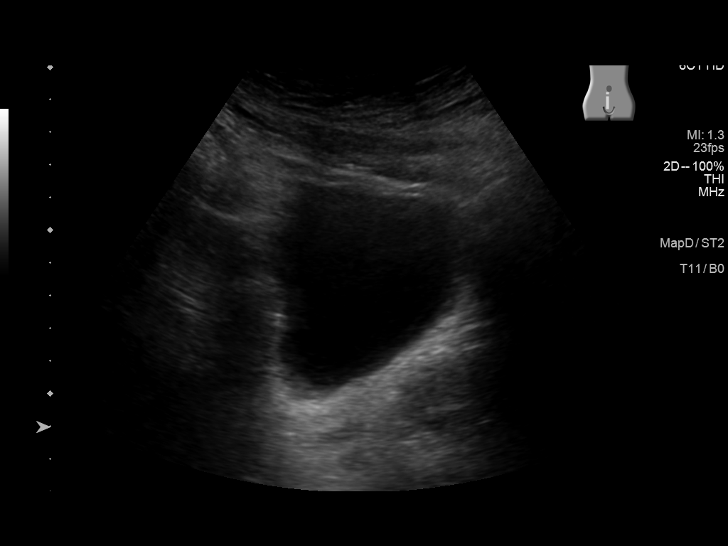

[14 of 22 positions shown; findings below may reference images not displayed]

FINDINGS: Right Kidney:

Renal measurements: 11.5 x 6.5 x 5.2 cm = volume: 206 mL.
Echogenicity and renal cortical thickness are within normal limits.
No mass, perinephric fluid, or hydronephrosis visualized. No
sonographically demonstrable calculus or ureterectasis.

Left Kidney:

Renal measurements: 12.4 x 6.1 x 5.6 cm = volume: 224 mL.
Echogenicity and renal cortical thickness are within normal limits.
No mass, perinephric fluid, or hydronephrosis visualized. No
sonographically demonstrable calculus or ureterectasis.

Bladder:

Appears normal for degree of bladder distention.

Other:

None.
IMPRESSION: Study within normal limits.

## 2021-04-18 ENCOUNTER — Ambulatory Visit
Admission: EM | Admit: 2021-04-18 | Discharge: 2021-04-18 | Disposition: A | Discharge status: Home / Self Care | Payer: BC Managed Care – PPO

## 2021-04-18 ENCOUNTER — Other Ambulatory Visit: Payer: Self-pay

## 2021-04-18 DIAGNOSIS — W540XXA Bitten by dog, initial encounter: Secondary | ICD-10-CM | POA: Diagnosis not present

## 2021-04-18 DIAGNOSIS — S61431A Puncture wound without foreign body of right hand, initial encounter: Secondary | ICD-10-CM | POA: Diagnosis not present

## 2021-04-18 HISTORY — DX: Essential (primary) hypertension: I10

## 2021-04-18 HISTORY — DX: Type 2 diabetes mellitus without complications: E11.9

## 2021-04-18 HISTORY — DX: Hyperlipidemia, unspecified: E78.5

## 2021-04-18 MED ORDER — AMOXICILLIN-POT CLAVULANATE 875-125 MG PO TABS: 1.0000 | ORAL_TABLET | Freq: Two times a day (BID) | ORAL | 0 refills | Status: DC

## 2021-04-18 MED ORDER — TETANUS-DIPHTH-ACELL PERTUSSIS 5-2.5-18.5 LF-MCG/0.5 IM SUSY
0.5000 mL | PREFILLED_SYRINGE | Freq: Once | INTRAMUSCULAR | Status: AC
  Administered 2021-04-18: 0.5 mL via INTRAMUSCULAR

## 2021-04-18 NOTE — ED Provider Notes (Signed)
[facility]    CSN: [id number] Arrival date & time: 04/18/21  1156      History   Chief Complaint Chief Complaint  Patient presents with   Animal Bite    HPI Darren Bean is a 66 y.o. male.   Patient presents today after a dog bite that occurred today prior to arrival.  She reports that he was attempting to leave his friend's house when a pit bull ran at him and bit his stomach then his right hand/wrist.  Patient has full range of motion of right upper extremity.  Denies any numbness or tingling.  Tetanus vaccine is not up-to-date per patient.  Patient reports that the dog belonged to his friend and rabies vaccines are up-to-date.  Patient is not on blood thinners.   Animal Bite  Past Medical History:  Diagnosis Date   Arthritis    Diabetes mellitus without complication (HCC)    GERD (gastroesophageal reflux disease)    takes tums as needed   Hx of diabetes mellitus    taken off metformin after wt loss - glucose  became normal   Hyperlipidemia    Hypertension    Seasonal allergies     Patient Active Problem List   Diagnosis Date Noted   Primary osteoarthritis of right hip 08/16/2014    Past Surgical History:  Procedure Laterality Date   KNEE ARTHROSCOPY  1994   rt knee   TONSILLECTOMY     TOTAL HIP ARTHROPLASTY Right 08/16/2014   Procedure: TOTAL RIGHT HIP ARTHROPLASTY ANTERIOR APPROACH;  Surgeon: Samson Frederic, MD;  Location: [facility];  Service: Orthopedics;  Laterality: Right;       Home Medications    Prior to Admission medications   Medication Sig Start Date End Date Taking? Authorizing Provider  amoxicillin-clavulanate (AUGMENTIN) 875-125 MG tablet Take 1 tablet by mouth every 12 (twelve) hours. 04/18/21  Yes Sheron Tallman, Rolly Salter E, FNP  losartan (COZAAR) 50 MG tablet Take 1 tablet by mouth daily. 04/28/20  Yes [provider]  omeprazole (PRILOSEC OTC) 20 MG tablet Take by mouth. 02/13/21 02/13/22 Yes [provider]  aspirin EC 325  MG EC tablet Take 1 tablet (325 mg total) by mouth 2 (two) times daily after a meal. 08/17/14   Swinteck, Arlys John, MD  atorvastatin (LIPITOR) 80 MG tablet Take 80 mg by mouth daily. 03/20/21   [provider]  azithromycin (ZITHROMAX) 250 MG tablet Take 2 tabs PO x 1 dose, then 1 tab PO QD x 4 days 11/12/14   Elvina Sidle, MD  calcium carbonate (TUMS - DOSED IN MG ELEMENTAL CALCIUM) 500 MG chewable tablet Chew 2 tablets by mouth 2 (two) times daily as needed for indigestion or heartburn.    [provider]  docusate sodium (COLACE) 100 MG capsule Take 1 capsule (100 mg total) by mouth 2 (two) times daily. 08/17/14   Swinteck, Arlys John, MD  HYDROcodone-acetaminophen (NORCO/VICODIN) 5-325 MG per tablet Take 1-2 tablets by mouth every 4 (four) hours as needed (breakthrough pain). 08/17/14   Swinteck, Arlys John, MD  HYDROcodone-homatropine (HYDROMET) 5-1.5 MG/5ML syrup Take 5 mLs by mouth every 6 (six) hours as needed for cough. 11/12/14   Elvina Sidle, MD  meloxicam (MOBIC) 15 MG tablet Take 1 tablet (15 mg total) by mouth daily. 08/17/14   Swinteck, Arlys John, MD  metFORMIN (GLUCOPHAGE-XR) 500 MG 24 hr tablet Take 1,000 mg by mouth 2 (two) times daily. 04/05/21   [provider]  ondansetron (ZOFRAN) 4 MG tablet Take 1  tablet (4 mg total) by mouth every 6 (six) hours as needed for nausea. 08/17/14   Swinteck, Arlys John, MD  senna (SENOKOT) 8.6 MG TABS tablet Take 2 tablets (17.2 mg total) by mouth at bedtime. 08/17/14   Swinteck, Arlys John, MD  TURMERIC PO Take 1 tablet by mouth every morning.    [provider]  Turmeric POWD Take by mouth.    [provider]    Family History History reviewed. No pertinent family history.  Social History Social History   Tobacco Use   Smoking status: Never  Substance Use Topics   Alcohol use: Yes    Comment: social   Drug use: No     Allergies   Other   Review of Systems Review of Systems   Physical Exam Triage Vital  Signs ED Triage Vitals  Enc Vitals Group     BP 04/18/21 1331 134/82     Pulse Rate 04/18/21 1331 81     Resp 04/18/21 1331 18     Temp 04/18/21 1331 98.3 F (36.8 C)     Temp Source 04/18/21 1331 Oral     SpO2 04/18/21 1331 96 %     Weight --      Height --      Head Circumference --      Peak Flow --      Pain Score 04/18/21 1332 0     Pain Loc --      Pain Edu? --      Excl. in GC? --    No data found.  Updated Vital Signs BP 134/82 (BP Location: Left Arm)    Pulse 81    Temp 98.3 F (36.8 C) (Oral)    Resp 18    SpO2 96%   Visual Acuity Right Eye Distance:   Left Eye Distance:   Bilateral Distance:    Right Eye Near:   Left Eye Near:    Bilateral Near:     Physical Exam Constitutional:      General: He is not in acute distress.    Appearance: Normal appearance. He is not toxic-appearing or diaphoretic.  HENT:     Head: Normocephalic and atraumatic.  Eyes:     Extraocular Movements: Extraocular movements intact.     Conjunctiva/sclera: Conjunctivae normal.  Pulmonary:     Effort: Pulmonary effort is normal.  Skin:    Comments: Patient has multiple puncture wounds to the dorsal surface of right hand as well as one puncture wound to palmar surface of right hand.  Patient also has multiple puncture wounds to dorsal surface of right forearm as well as palmar surface of right forearm.  All the puncture wounds are superficial.  No tendons noted.  No severe lacerations.  Patient has full range of motion of fingers, hand, wrist.  No numbness or tingling.  Neurovascular intact.  Grip strength 5/5.  Patient also has multiple puncture wounds located to right lower abdomen.  All of these puncture wounds are superficial and are basically abrasions.  Bleeding is controlled with all of the puncture wounds.  Neurological:     General: No focal deficit present.     Mental Status: He is alert and oriented to person, place, and time. Mental status is at baseline.  Psychiatric:         Mood and Affect: Mood normal.        Behavior: Behavior normal.        Thought Content: Thought content normal.  Judgment: Judgment normal.     UC Treatments / Results  Labs (all labs ordered are listed, but only abnormal results are displayed) Labs Reviewed - No data to display  EKG   Radiology No results found.  Procedures Procedures (including critical care time)  Medications Ordered in UC Medications  Tdap (BOOSTRIX) injection 0.5 mL (0.5 mLs Intramuscular Given 04/18/21 1400)    Initial Impression / Assessment and Plan / UC Course  I have reviewed the triage vital signs and the nursing notes.  Pertinent labs & imaging results that were available during my care of the patient were reviewed by me and considered in my medical decision making (see chart for details).     Wounds were cleansed, antibiotic ointment applied, and dressings were applied.  Discussed wound care and dressings with patient.  Patient to monitor for signs of infection.  Tetanus vaccine updated.  Will prescribe Augmentin given that this is a dog bite.  I do not think that rabies prophylaxis needs to be initiated given that patient reported that rabies vaccines were updated with the dog that bit him.  Discussed return precautions.  Patient verbalized understanding and was agreeable with plan. Final Clinical Impressions(s) / UC Diagnoses   Final diagnoses:  Dog bite, initial encounter     Discharge Instructions      Dressings has been applied to your wounds.  Please change daily and clean.  Follow-up if any signs of infection occur including increased redness, swelling, pus.  An antibiotic has been prescribed to help treat/prevent infection.    ED Prescriptions     Medication Sig Dispense Auth. Provider   amoxicillin-clavulanate (AUGMENTIN) 875-125 MG tablet Take 1 tablet by mouth every 12 (twelve) hours. 14 tablet [place], Acie Fredrickson, [place]      PDMP not reviewed this encounter.    Gustavus Bryant, [place] 04/18/21 1401

## 2021-04-18 NOTE — Discharge Instructions (Signed)
Dressings has been applied to your wounds.  Please change daily and clean.  Follow-up if any signs of infection occur including increased redness, swelling, pus.  An antibiotic has been prescribed to help treat/prevent infection.

## 2021-04-18 NOTE — ED Triage Notes (Signed)
Pt c/o dog bite that happened today. States pitt bull of a friend. States friend has evidence of rabies though did not bring to clinical. States last tetanus > 10 years ago. There are multiple puncture wounds to right hand, right forearm, anf RLQ abd.

## 2021-08-13 DIAGNOSIS — I10 Essential (primary) hypertension: Secondary | ICD-10-CM | POA: Insufficient documentation

## 2022-10-04 ENCOUNTER — Emergency Department
Admission: EM | Admit: 2022-10-04 | Discharge: 2022-10-04 | Disposition: A | Discharge status: Home / Self Care | Payer: BC Managed Care – PPO | Attending: Emergency Medicine | Admitting: Emergency Medicine

## 2022-10-04 ENCOUNTER — Emergency Department: Payer: BC Managed Care – PPO

## 2022-10-04 ENCOUNTER — Other Ambulatory Visit: Payer: Self-pay

## 2022-10-04 DIAGNOSIS — N3000 Acute cystitis without hematuria: Secondary | ICD-10-CM | POA: Insufficient documentation

## 2022-10-04 DIAGNOSIS — R350 Frequency of micturition: Secondary | ICD-10-CM | POA: Diagnosis present

## 2022-10-04 DIAGNOSIS — R059 Cough, unspecified: Secondary | ICD-10-CM | POA: Insufficient documentation

## 2022-10-04 DIAGNOSIS — R630 Anorexia: Secondary | ICD-10-CM | POA: Diagnosis not present

## 2022-10-04 DIAGNOSIS — R791 Abnormal coagulation profile: Secondary | ICD-10-CM | POA: Insufficient documentation

## 2022-10-04 DIAGNOSIS — Z20822 Contact with and (suspected) exposure to covid-19: Secondary | ICD-10-CM | POA: Insufficient documentation

## 2022-10-04 DIAGNOSIS — E119 Type 2 diabetes mellitus without complications: Secondary | ICD-10-CM | POA: Insufficient documentation

## 2022-10-04 DIAGNOSIS — I10 Essential (primary) hypertension: Secondary | ICD-10-CM | POA: Diagnosis not present

## 2022-10-04 LAB — CBC WITH DIFFERENTIAL/PLATELET
Abs Immature Granulocytes: 0.02 10*3/uL (ref 0.00–0.07)
Basophils Absolute: 0 10*3/uL (ref 0.0–0.1)
Basophils Relative: 0 %
Eosinophils Absolute: 0 10*3/uL (ref 0.0–0.5)
Eosinophils Relative: 0 %
HCT: 46.8 % (ref 39.0–52.0)
Hemoglobin: 15.9 g/dL (ref 13.0–17.0)
Immature Granulocytes: 0 %
Lymphocytes Relative: 4 %
Lymphs Abs: 0.2 10*3/uL — ABNORMAL LOW (ref 0.7–4.0)
MCH: 29.7 pg (ref 26.0–34.0)
MCHC: 34 g/dL (ref 30.0–36.0)
MCV: 87.5 fL (ref 80.0–100.0)
Monocytes Absolute: 0.4 10*3/uL (ref 0.1–1.0)
Monocytes Relative: 7 %
Neutro Abs: 4.9 10*3/uL (ref 1.7–7.7)
Neutrophils Relative %: 89 %
Platelets: 133 10*3/uL — ABNORMAL LOW (ref 150–400)
RBC: 5.35 MIL/uL (ref 4.22–5.81)
RDW: 12.2 % (ref 11.5–15.5)
WBC: 5.6 10*3/uL (ref 4.0–10.5)
nRBC: 0 % (ref 0.0–0.2)

## 2022-10-04 LAB — URINALYSIS, W/ REFLEX TO CULTURE (INFECTION SUSPECTED)
Bilirubin Urine: NEGATIVE
Glucose, UA: NEGATIVE mg/dL
Ketones, ur: NEGATIVE mg/dL
Leukocytes,Ua: NEGATIVE
Nitrite: NEGATIVE
Protein, ur: 30 mg/dL — AB
Specific Gravity, Urine: 1.008 (ref 1.005–1.030)
Squamous Epithelial / HPF: NONE SEEN /HPF (ref 0–5)
pH: 6 (ref 5.0–8.0)

## 2022-10-04 LAB — COMPREHENSIVE METABOLIC PANEL
ALT: 25 U/L (ref 0–44)
AST: 22 U/L (ref 15–41)
Albumin: 3.9 g/dL (ref 3.5–5.0)
Alkaline Phosphatase: 58 U/L (ref 38–126)
Anion gap: 10 (ref 5–15)
BUN: 18 mg/dL (ref 8–23)
CO2: 21 mmol/L — ABNORMAL LOW (ref 22–32)
Calcium: 9 mg/dL (ref 8.9–10.3)
Chloride: 101 mmol/L (ref 98–111)
Creatinine, Ser: 0.97 mg/dL (ref 0.61–1.24)
GFR, Estimated: >60 mL/min (ref >60)
Glucose, Bld: 145 mg/dL — ABNORMAL HIGH (ref 70–99)
Potassium: 3.8 mmol/L (ref 3.5–5.1)
Sodium: 132 mmol/L — ABNORMAL LOW (ref 135–145)
Total Bilirubin: 1.5 mg/dL — ABNORMAL HIGH (ref 0.3–1.2)
Total Protein: 7 g/dL (ref 6.5–8.1)

## 2022-10-04 LAB — LIPASE, BLOOD: Lipase: 36 U/L (ref 11–51)

## 2022-10-04 LAB — LACTIC ACID, PLASMA: Lactic Acid, Venous: 1.2 mmol/L (ref 0.5–1.9)

## 2022-10-04 LAB — SARS CORONAVIRUS 2 BY RT PCR: SARS Coronavirus 2 by RT PCR: NEGATIVE

## 2022-10-04 LAB — PROTIME-INR
INR: 1 (ref 0.8–1.2)
Prothrombin Time: 13.8 seconds (ref 11.4–15.2)

## 2022-10-04 MED ORDER — CEPHALEXIN 500 MG PO CAPS
500.0000 mg | ORAL_CAPSULE | Freq: Once | ORAL | Status: AC
  Administered 2022-10-04: 500 mg via ORAL
  Filled 2022-10-04: qty 1

## 2022-10-04 MED ORDER — CEPHALEXIN 500 MG PO CAPS: 500.0000 mg | ORAL_CAPSULE | Freq: Two times a day (BID) | ORAL | 0 refills | Status: DC

## 2022-10-04 MED ORDER — ONDANSETRON 4 MG PO TBDP: 4.0000 mg | ORAL_TABLET | Freq: Three times a day (TID) | ORAL | 0 refills | Status: DC | PRN

## 2022-10-04 MED ORDER — CEPHALEXIN 500 MG PO CAPS: 500.0000 mg | ORAL_CAPSULE | Freq: Two times a day (BID) | ORAL | 0 refills | Status: AC

## 2022-10-04 MED ORDER — IOHEXOL 300 MG/ML  SOLN
100.0000 mL | Freq: Once | INTRAMUSCULAR | Status: AC | PRN
  Administered 2022-10-04: 100 mL via INTRAVENOUS

## 2022-10-04 MED ORDER — LACTATED RINGERS IV BOLUS
1000.0000 mL | Freq: Once | INTRAVENOUS | Status: AC
  Administered 2022-10-04: 1000 mL via INTRAVENOUS

## 2022-10-04 MED ORDER — ONDANSETRON HCL 4 MG/2ML IJ SOLN
4.0000 mg | Freq: Once | INTRAMUSCULAR | Status: AC
  Administered 2022-10-04: 4 mg via INTRAVENOUS
  Filled 2022-10-04: qty 2

## 2022-10-04 MED ORDER — PANTOPRAZOLE SODIUM 40 MG IV SOLR
40.0000 mg | Freq: Once | INTRAVENOUS | Status: AC
  Administered 2022-10-04: 40 mg via INTRAVENOUS
  Filled 2022-10-04: qty 10

## 2022-10-04 NOTE — ED Provider Notes (Signed)
[facility] Provider Note    Event Date/Time   First MD Initiated Contact with Patient 10/04/22 1604     (approximate)   History   Chief Complaint: Urinary Tract Infection   HPI  Darren Bean is a 67 y.o. male with a history of hypertension hyperlipidemia diabetes who comes to the ED due to possible UTI.  Patient was in nephrology clinic, noted to have tachycardia and hypotension and sent to the ED due to concern for possible sepsis.  Patient endorses urinary frequency and dysuria for the past 2 or 3 days.  Denies dizziness syncope or falls.  No chest pain shortness of breath.  Also endorses chills and nonproductive cough for the past few days.  No sick contacts.  No vomiting or diarrhea.  Does report he has had decreased oral intake due to loss of appetite for the past 3 days.       Physical Exam   Triage Vital Signs: ED Triage Vitals  Encounter Vitals Group     BP 10/04/22 1431 114/75     Systolic BP Percentile --      Diastolic BP Percentile --      Pulse Rate 10/04/22 1431 100     Resp 10/04/22 1431 19     Temp 10/04/22 1431 100.1 F (37.8 C)     Temp Source 10/04/22 1431 Oral     SpO2 10/04/22 1431 94 %     Weight 10/04/22 1430 205 lb (93 kg)     Height 10/04/22 1430 5\' 9"  (1.753 m)     Head Circumference --      Peak Flow --      Pain Score 10/04/22 1431 0     Pain Loc --      Pain Education --      Exclude from Growth Chart --     Most recent vital signs: Vitals:   10/04/22 1604 10/04/22 1915  BP: 124/79   Pulse: 85 81  Resp: 16   Temp: 98.8 F (37.1 C)   SpO2: 100% 98%    General: Awake, no distress.  CV:  Good peripheral perfusion.  Regular rate and rhythm Resp:  Normal effort.  Clear to auscultation bilaterally Abd:  No distention.  Soft with suprapubic tenderness Other:  Dry oral mucosa.  No rash.   ED Results / Procedures / Treatments   Labs (all labs ordered are listed, but only abnormal results are displayed) Labs  Reviewed  COMPREHENSIVE METABOLIC PANEL - Abnormal; Notable for the following components:      Result Value   Sodium 132 (*)    CO2 21 (*)    Glucose, Bld 145 (*)    Total Bilirubin 1.5 (*)    All other components within normal limits  CBC WITH DIFFERENTIAL/PLATELET - Abnormal; Notable for the following components:   Platelets 133 (*)    Lymphs Abs 0.2 (*)    All other components within normal limits  SARS CORONAVIRUS 2 BY RT PCR  CULTURE, BLOOD (ROUTINE X 2)  CULTURE, BLOOD (ROUTINE X 2)  LACTIC ACID, PLASMA  PROTIME-INR  LIPASE, BLOOD  URINALYSIS, W/ REFLEX TO CULTURE (INFECTION SUSPECTED)     EKG    RADIOLOGY Chest x-ray interpreted by me, appears normal.  Radiology report reviewed.  CT abdomen pelvis shows evidence of cystitis without other acute findings.   PROCEDURES:  Procedures   MEDICATIONS ORDERED IN ED: Medications  cephALEXin (KEFLEX) capsule 500 mg (has no administration in  time range)  lactated ringers bolus 1,000 mL (0 mLs Intravenous Stopped 10/04/22 1700)  ondansetron (ZOFRAN) injection 4 mg (4 mg Intravenous Given 10/04/22 1654)  pantoprazole (PROTONIX) injection 40 mg (40 mg Intravenous Given 10/04/22 1654)  iohexol (OMNIPAQUE) 300 MG/ML solution 100 mL (100 mLs Intravenous Contrast Given 10/04/22 1703)     IMPRESSION / MDM / ASSESSMENT AND PLAN / ED COURSE  I reviewed the triage vital signs and the nursing notes.  DDx: UTI, ureterolithiasis, pyelonephritis, COVID, viral syndrome, dehydration, AKI, electrolyte abnormality  Patient's presentation is most consistent with acute presentation with potential threat to life or bodily function.  Patient comes to the ED due to urinary symptoms and abnormal vital signs in nephrology clinic.  In the ED, vital signs are essentially normal with mildly elevated body temperature but afebrile.  Lab panel is reassuring, COVID-negative.  No signs of sepsis.  He does clinically appear to be dehydrated due to poor  oral intake.  Patient given IV fluids.  ----------------------------------------- 8:00 PM on 10/04/2022 ----------------------------------------- Urinalysis results still not available from the lab.  CT shows signs of UTI, will start Keflex.  He is nontoxic, symptoms are minimal, vital signs and labs reassuring, stable for outpatient follow-up.       FINAL CLINICAL IMPRESSION(S) / ED DIAGNOSES   Final diagnoses:  Acute cystitis without hematuria  Type 2 diabetes mellitus without complication, without long-term current use of insulin (HCC)     Rx / DC Orders   ED Discharge Orders          Ordered    cephALEXin (KEFLEX) 500 MG capsule  2 times daily        10/04/22 2002             Note:  This document was prepared using Dragon voice recognition software and may include unintentional dictation errors.   Sharman Cheek, MD 10/04/22 2002

## 2022-10-04 NOTE — ED Triage Notes (Signed)
Pt arrives via POV, sent from Dr Cherylann Ratel with possible UTI that has turned into sepsis per dr.

## 2022-12-29 ENCOUNTER — Other Ambulatory Visit
Admission: RE | Admit: 2022-12-29 | Discharge: 2022-12-29 | Disposition: A | Discharge status: Home / Self Care | Payer: BC Managed Care – PPO | Attending: Urology | Admitting: Urology

## 2022-12-29 ENCOUNTER — Encounter: Payer: Self-pay | Admitting: Urology

## 2022-12-29 ENCOUNTER — Other Ambulatory Visit: Payer: Self-pay | Admitting: *Deleted

## 2022-12-29 ENCOUNTER — Ambulatory Visit: Payer: BC Managed Care – PPO | Admitting: Urology

## 2022-12-29 VITALS — BP 147/83 | HR 69 | Ht 69.0 in | Wt 207.8 lb

## 2022-12-29 DIAGNOSIS — N390 Urinary tract infection, site not specified: Secondary | ICD-10-CM | POA: Insufficient documentation

## 2022-12-29 DIAGNOSIS — Z87898 Personal history of other specified conditions: Secondary | ICD-10-CM

## 2022-12-29 DIAGNOSIS — Z09 Encounter for follow-up examination after completed treatment for conditions other than malignant neoplasm: Secondary | ICD-10-CM

## 2022-12-29 LAB — URINALYSIS, COMPLETE (UACMP) WITH MICROSCOPIC
Bilirubin Urine: NEGATIVE
Glucose, UA: NEGATIVE mg/dL
Hgb urine dipstick: NEGATIVE
Ketones, ur: NEGATIVE mg/dL
Leukocytes,Ua: NEGATIVE
Nitrite: NEGATIVE
Protein, ur: NEGATIVE mg/dL
Specific Gravity, Urine: 1.015 (ref 1.005–1.030)
Squamous Epithelial / HPF: NONE SEEN /[HPF] (ref 0–5)
WBC, UA: NONE SEEN WBC/hpf (ref 0–5)
pH: 7 (ref 5.0–8.0)

## 2022-12-29 LAB — BLADDER SCAN AMB NON-IMAGING

## 2022-12-29 NOTE — Patient Instructions (Signed)
You likely had a prostate infection(prostatitis) that was incompletely treated until you had a longer course of antibiotics.  Please let us know if you have recurrent urinary symptoms.  The best antibiotics for prostate infections are Bactrim, Cipro, doxycycline, and a 2 to 4-week course.

## 2022-12-29 NOTE — Progress Notes (Signed)
   12/29/22 10:23 AM   Darren Bean [birth year] [id number]  CC: UTI/prostatitis, PSA screening  HPI: 67 year old male with obesity and BMI of 31, well-controlled diabetes, who originally developed urinary symptoms with dark-colored urine, dysuria, urinary frequency, and overall malaise in early August 2024.  He was originally treated with a 5-day course of antibiotics, then a 10-day course of amoxicillin, and finally a 14-day of Augmentin which ultimately resolved his urinary symptoms.  He was seen in the ER on 10/04/2022 and urine culture grew E. coli, CT scan at that time was essentially benign with no hydronephrosis or bladder distention, no evidence of stones, possible cystitis.  He reports his symptoms have improved and it was completely resolved after the 2-week course of antibiotics.  He denies any dysuria or gross hematuria.  He denies any significant urinary symptoms prior to the infection.  Urinalysis today is completely benign.  Bladder scan today , however he reports he has the sensation to void and has not emptied recently.  PSA was 1.69 in December 2022.   PMH: Past Medical History:  Diagnosis Date   Arthritis    Diabetes mellitus without complication (HCC)    GERD (gastroesophageal reflux disease)    takes tums as needed   Hx of diabetes mellitus    taken off metformin after wt loss - glucose  became normal   Hyperlipidemia    Hypertension    Seasonal allergies     Surgical History: Past Surgical History:  Procedure Laterality Date   KNEE ARTHROSCOPY  1994   rt knee   TONSILLECTOMY     TOTAL HIP ARTHROPLASTY Right 08/16/2014   Procedure: TOTAL RIGHT HIP ARTHROPLASTY ANTERIOR APPROACH;  Surgeon: Samson Frederic, MD;  Location: [facility];  Service: Orthopedics;  Laterality: Right;    Social History:  reports that he has never smoked. He does not have any smokeless tobacco history on file. He reports current alcohol use. He reports that he does not use drugs.  Physical  Exam: BP (!) 147/83 (BP Location: Left Arm, Patient Position: Sitting, Cuff Size: Normal)   Pulse 69   Ht 5\' 9"  (1.753 m)   Wt 207 lb 12.8 oz (94.3 kg)   BMI 30.69 kg/m    Constitutional:  Alert and oriented, No acute distress. Cardiovascular: No clubbing, cyanosis, or edema. Respiratory: Normal respiratory effort, no increased work of breathing. GI: Abdomen is soft, nontender, nondistended, no abdominal masses   Laboratory Data: Reviewed, see HPI  Pertinent Imaging: I have personally viewed and interpreted the CT dated 8/12 showing no hydronephrosis or bladder distention, no evidence of stones, prostate measures 61g.  Assessment & Plan:   67 year old male who likely had an episode of prostatitis that was incompletely treated with multiple short courses of antibiotics with poor prostate penetration, symptoms ultimately resolved after 2-week course of Augmentin and urinalysis today is normal and he denies any significant urinary symptoms.  We discussed options for further evaluation including cystoscopy, trial of Flomax, or close follow-up.  He prefers close follow-up and return precautions were discussed extensively.  If recurrent infections would recommend cystoscopy.  We discussed timed voiding and cranberry tablets for UTI prevention.  RTC 3 to 4 months PVR, sooner if problems-if recurrent prostatitis recommend 3 to 4-week course of antibiotic with better tissue penetration like Bactrim or doxycycline  Legrand Rams, MD 12/29/2022  [facility] [address] [place], [place] [address] [phone]

## 2023-02-04 ENCOUNTER — Ambulatory Visit: Payer: BC Managed Care – PPO

## 2023-02-04 DIAGNOSIS — D123 Benign neoplasm of transverse colon: Secondary | ICD-10-CM

## 2023-02-04 DIAGNOSIS — Z1211 Encounter for screening for malignant neoplasm of colon: Secondary | ICD-10-CM

## 2023-02-04 DIAGNOSIS — K640 First degree hemorrhoids: Secondary | ICD-10-CM | POA: Diagnosis not present

## 2023-02-04 DIAGNOSIS — Z860100 Personal history of colon polyps, unspecified: Secondary | ICD-10-CM

## 2023-04-29 ENCOUNTER — Other Ambulatory Visit: Payer: Self-pay

## 2023-04-29 DIAGNOSIS — N390 Urinary tract infection, site not specified: Secondary | ICD-10-CM

## 2023-05-03 ENCOUNTER — Ambulatory Visit: Payer: BC Managed Care – PPO | Admitting: Urology

## 2023-05-03 ENCOUNTER — Other Ambulatory Visit
Admission: RE | Admit: 2023-05-03 | Discharge: 2023-05-03 | Disposition: A | Discharge status: Home / Self Care | Attending: Urology | Admitting: Urology

## 2023-05-03 ENCOUNTER — Telehealth: Payer: Self-pay

## 2023-05-03 ENCOUNTER — Encounter: Payer: Self-pay | Admitting: Urology

## 2023-05-03 VITALS — BP 146/82 | Ht 69.0 in | Wt 207.0 lb

## 2023-05-03 DIAGNOSIS — Z3009 Encounter for other general counseling and advice on contraception: Secondary | ICD-10-CM | POA: Diagnosis not present

## 2023-05-03 DIAGNOSIS — N390 Urinary tract infection, site not specified: Secondary | ICD-10-CM | POA: Diagnosis present

## 2023-05-03 DIAGNOSIS — E119 Type 2 diabetes mellitus without complications: Secondary | ICD-10-CM | POA: Insufficient documentation

## 2023-05-03 DIAGNOSIS — M1990 Unspecified osteoarthritis, unspecified site: Secondary | ICD-10-CM | POA: Insufficient documentation

## 2023-05-03 DIAGNOSIS — K219 Gastro-esophageal reflux disease without esophagitis: Secondary | ICD-10-CM | POA: Insufficient documentation

## 2023-05-03 DIAGNOSIS — R399 Unspecified symptoms and signs involving the genitourinary system: Secondary | ICD-10-CM

## 2023-05-03 DIAGNOSIS — Z87440 Personal history of urinary (tract) infections: Secondary | ICD-10-CM

## 2023-05-03 DIAGNOSIS — E781 Pure hyperglyceridemia: Secondary | ICD-10-CM | POA: Insufficient documentation

## 2023-05-03 LAB — URINALYSIS, COMPLETE (UACMP) WITH MICROSCOPIC
Bacteria, UA: NONE SEEN
Bilirubin Urine: NEGATIVE
Glucose, UA: NEGATIVE mg/dL
Hgb urine dipstick: NEGATIVE
Ketones, ur: NEGATIVE mg/dL
Leukocytes,Ua: NEGATIVE
Nitrite: NEGATIVE
Protein, ur: NEGATIVE mg/dL
RBC / HPF: NONE SEEN RBC/hpf (ref 0–5)
Specific Gravity, Urine: 1.015 (ref 1.005–1.030)
Squamous Epithelial / HPF: NONE SEEN /HPF (ref 0–5)
WBC, UA: NONE SEEN WBC/hpf (ref 0–5)
pH: 8.5 — ABNORMAL HIGH (ref 5.0–8.0)

## 2023-05-03 LAB — BLADDER SCAN AMB NON-IMAGING: Scan Result: 6

## 2023-05-03 MED ORDER — TAMSULOSIN HCL 0.4 MG PO CAPS: 0.4000 mg | ORAL_CAPSULE | Freq: Every day | ORAL | 3 refills | Status: AC

## 2023-05-03 NOTE — Progress Notes (Signed)
   05/03/2023 10:48 AM   Darren Bean [birth year] [id number]  Reason for visit: Follow up UTI/prostatitis, urinary symptoms, PSA screening, discussed vasectomy, ED  HPI: 67 year old male who I originally met in November 2020 for after he had urinary symptoms of dark-colored urine, dysuria, urinary frequency, and malaise in August 2024.  Urine culture was positive for E. Coli, CT was benign.  He was treated with multiple short courses of antibiotics, and ultimately a 14-day course of Augmentin which resolved his symptoms.  PSA was normal at 1.69 in December 2022.  Today he reports some mild residual urinary problems including occasional urgency and some altered sensation when initiating urination.  He denies any gross hematuria or dysuria.  He has nocturia twice per night which is increased from 1 time prior.  Urinalysis today is completely benign.  PVR today normal at 6ml.  We discussed options including a repeat trial of antibiotics for possible persistent prostate infection versus trial of Flomax, and he was interested in a trial of Flomax.  He is not at the point where he is interested in trying medications for ED.  He is interested in vasectomy for permanent sterilization.  He has grown children, currently using condoms for contraception and interested in more definitive contraception option.  We discussed the risks and benefits of vasectomy at length.  Vasectomy is intended to be a permanent form of contraception, and does not produce immediate sterility.  Following vasectomy another form of contraception is required until vas occlusion is confirmed by a post-vasectomy semen analysis obtained 2-3 months after the procedure.  Even after vas occlusion is confirmed, vasectomy is not 100% reliable in preventing pregnancy, and the failure rate is approximately 02/1998.  Repeat vasectomy is required in less than 1% of patients.  He should refrain from ejaculation for 1 week after vasectomy.  Options for  fertility after vasectomy include vasectomy reversal, and sperm retrieval with in vitro fertilization or ICSI.  These options are not always successful and may be expensive.  Finally, there are other permanent and non-permanent alternatives to vasectomy available. There is no risk of erectile dysfunction, and the volume of semen will be similar to prior, as the majority of the ejaculate is from the prostate and seminal vesicles.   The procedure takes ~20 minutes.  We recommend patients take 5-10 mg of Valium 30 minutes prior, and he will need a driver post-procedure.  Local anesthetic is injected into the scrotal skin and a small segment of the vas deferens is removed, and the ends occluded. The complication rate is approximately 1-2%, and includes bleeding, infection, and development of chronic scrotal pain.  PLAN: Trial of flomax for urinary symptoms Schedule vasectomy Valium sent to pharmacy-will discuss urinary symptoms at that visit    Sondra Come, MD  [facility] [address] [place], [place] [address] [phone]

## 2023-05-03 NOTE — Patient Instructions (Signed)
 Pre-Vasectomy Instructions  STOP all aspirin or blood thinners (Aspirin, Plavix, Coumadin, Warfarin, Motrin, Ibuprofen, Advil, Aleve, Naproxen, Naprosyn) for 7 days prior to the procedure.  If you have any questions about stopping these medications please contact your primary care physician or cardiologist.  Shave all hair from the upper scrotum on the day of the procedure.  This means just under the penis onto the scrotal sac.  The area shaved should measure about 2-3 inches around.  You may lather the scrotum with soap and water, and shave with a safety razor.  After shaving the area, thoroughly wash the penis and the scrotum, then shower or bathe to remove all the loose hairs.  If needed, wash the area again just before coming in for your Vasectomy.  It is recommended to have a light meal an hour or so prior to the procedure.  Bring a scrotal support (jock strap or suspensory, or tight jockey shorts or underwear).  Wear comfortable pants or shorts.  While the actual procedure usually takes about 20 minutes, you should be prepared to stay in the office for approximately one hour.  Bring someone with you to drive you home.  If you have any questions or concerns, please feel free to call the office at [phone].  Vasectomy Vasectomy is a procedure in which the vas deferens is cut and then tied or burned (cauterized). The vas deferens is a tube that carries sperm from the testicle to the part of the body that drains urine from the bladder (urethra). This procedure blocks sperm from going through the vas deferens and penis during ejaculation. This ensures that sperm does not go into the vagina during sex. Vasectomy does not affect sexual desire or performance and does not prevent sexually transmitted infections. Vasectomy is considered a permanent and very effective form of birth control (contraception). The decision to have a vasectomy should not be made during a stressful time, such as after  the loss of a pregnancy or a divorce. You and your partner should decide on whether to have a vasectomy when you are sure that you do not want children in the future. Tell a health care provider about: Any allergies you have. All medicines you are taking, including vitamins, herbs, eye drops, creams, and over-the-counter medicines. Any problems you or family members have had with anesthetic medicines. Any blood disorders you have. Any surgeries you have had. Any medical conditions you have. What are the risks? Generally, this is a safe procedure. However, problems may occur, including: Infection. Bleeding and swelling of the scrotum. The scrotum is the sac that contains the testicles, blood vessels, and structures that help deliver sperm and semen. Allergic reactions to medicines. Failure of the procedure to prevent pregnancy. There is a very small chance that the tied or cauterized ends of the vas deferens may reconnect (recanalization). If this happens, you could still make a woman pregnant. Pain in the scrotum that continues after you heal from the procedure. What happens before the procedure? Medicines Ask your health care provider about: Changing or stopping your regular medicines. This is especially important if you are taking diabetes medicines or blood thinners. Taking medicines such as aspirin and ibuprofen. These medicines can thin your blood. Do not take these medicines unless your health care provider tells you to take them. Taking over-the-counter medicines, vitamins, herbs, and supplements. You may be told to take a medicine to help you relax (sedative) a few hours before the procedure. General instructions Do not  use any products that contain nicotine or tobacco for at least 4 weeks before the procedure. These products include cigarettes, e-cigarettes, and chewing tobacco. If you need help quitting, ask your health care provider. Plan to have a responsible adult take you home  from the hospital or clinic. If you will be going home right after the procedure, plan to have a responsible adult care for you for the time you are told. This is important. Ask your health care provider: How your surgery site will be marked. What steps will be taken to help prevent infection. These steps may include: Removing hair at the surgery site. Washing skin with a germ-killing soap. Taking antibiotic medicine. What happens during the procedure?  You will be given one or more of the following: A sedative, unless you were told to take this a few hours before the procedure. A medicine to numb the area (local anesthetic). Your health care provider will feel, or palpate, for your vas deferens. To reach the vas deferens, one of two methods may be used: A very small incision may be made in your scrotum. A punctured opening may be made in your scrotum, without an incision. Your vas deferens will be pulled out of your scrotum and cut. Then, the vas deferens will be closed in one of two ways: Tied at the ends. Cauterized at the ends to seal them off. The vas deferens will be put back into your scrotum. The incision or puncture opening will be closed with absorbable stitches (sutures). The sutures will eventually dissolve and will not need to be removed after the procedure. The procedure will be repeated on the other side of your scrotum. The procedure may vary among health care providers and hospitals. What happens after the procedure? You will be monitored to make sure that you do not have problems. You will be asked not to ejaculate for at least 1 week after the procedure, or for as long as you are told. You will need to use a different form of contraception for 2-4 months after the procedure, until you have test results confirming that there are no sperm in your semen. You may be given scrotal support to wear, such as a jockstrap or underwear with a supportive pouch. If you were given a  sedative during the procedure, it can affect you for several hours. Do not drive or operate machinery until your health care provider says that it is safe. Summary Vasectomy blocks sperm from being released during ejaculation. This procedure is considered a permanent and very effective form of birth control. Your scrotum will be numbed with medicine (local anesthetic) for the procedure. After the procedure, you will be asked not to ejaculate for at least 1 week, or for as long as you are told. You will also need to use a different form of contraception until your test results confirm that there are no sperm in your semen. This information is not intended to replace advice given to you by your health care provider. Make sure you discuss any questions you have with your health care provider. Document Revised: 06/28/2019 Document Reviewed: 06/28/2019 Else[employer] Patient Education  2023 [employer]  Vasectomy, Care After This sheet gives you information about how to care for yourself after your procedure. Your health care provider may also give you more specific instructions. If you have problems or questions, contact your health care provider. What can I expect after the procedure? After the procedure, it is common to have: Mild pain, swelling,  or discomfort in your scrotum or redness on your scrotum. Some blood coming from your incisions or puncture sites for 1 or 2 days. Blood in your semen. Follow these instructions at home: Medicines Take over-the-counter and prescription medicines only as told by your health care provider. Avoid taking any medicines that contain aspirin or NSAIDs, such as ibuprofen. These medicines can make bleeding worse. Activity For the first 2 days after surgery, avoid physical activity and exercise that requires a lot of energy. Ask your health care provider what activities are safe for you. Do not take part in sports or perform heavy physical labor until your pain has  improved, or until your health care provider says it is okay. You may have limits on the amount of weight you can lift as told by your health care provider. Do not ejaculate for at least 1 week after the procedure, or for as long as you are told. You may resume sexual activity 7-10 days after your procedure, or when your health care provider approves. Use a different method of birth control (contraception) until you have had test results that confirm that there is no sperm in your semen. Scrotal support Use scrotal support, such as a jockstrap or underwear with a supportive pouch, as needed for 1 week after your procedure. If you feel discomfort in your scrotum, you may remove the scrotal support to see if the discomfort is relieved. Sometimes scrotal support can press on the scrotum and cause or worsen discomfort. If your skin gets irritated, you may add some germ-free (sterile), fluffed bandages or a clean washcloth to the scrotal support. Managing pain and swelling If directed, put ice on the affected area. To do this: Put ice in a plastic bag. Place a towel between your skin and the bag. Leave the ice on for 15 minutes, 3-4 times a day. Remove the ice if your skin turns bright red. This is very important. If you cannot feel pain, heat, or cold, you have a greater risk of damage to the area.  General instructions  Check your incisions or puncture sites every day for signs of infection. Check for: Redness, swelling, or more pain. Fluid or blood. Warmth. Pus or a bad smell. Leave stitches (sutures) in place. The sutures will dissolve on their own and do not need to be removed. Keep all follow-up visits. This is important because you will need a test to confirm that there is no sperm in your semen. Multiple ejaculations are needed to clear out sperm that were beyond the vasectomy site. You will need one test result showing that there is no sperm in your semen before you can resume unprotected  sex. This may take 2-4 months after your procedure. If you were given a sedative during the procedure, it can affect you for several hours. Do not drive or operate machinery until your health care provider says that it is safe. Contact a health care provider if: You have redness, swelling, or more pain around an incision or puncture site, or in your scrotum area. You have bleeding from an incision or puncture site. You have pus or a bad smell coming from an incision or puncture site. You have a fever. An incision or puncture site opens up. Get help right away if: You develop a rash. You have trouble breathing. Summary After your procedure, it is common to have mild pain, swelling, redness, or discomfort in your scrotum. For the first 2 days after surgery, avoid physical activity and  exercise that requires a lot of energy. Put ice on the affected area. Leave the ice on for 20 minutes, 2-3 times a day. If you were given a sedative during the procedure, it can affect you for several hours. Do not drive or operate machinery until your health care provider says that it is safe. This information is not intended to replace advice given to you by your health care provider. Make sure you discuss any questions you have with your health care provider. Document Revised: 06/28/2019 Document Reviewed: 06/28/2019 Elsevier Patient Education  2023 [employer]   Nocturia refers to the need to wake up during the night to urinate, which can disrupt your sleep and impact your overall well-being. Fortunately, there are several strategies you can employ to help prevent or manage nocturia. It's important to consult with your healthcare provider before making any significant changes to your routine. Here are some helpful strategies to consider:  Limit Fluid Intake Before Bed: Avoid drinking large amounts of fluids in the evening, especially within a few hours of bedtime. Consume most of your daily fluid intake  earlier in the day to reduce the need to urinate at night.  Monitor Your Diet: Limit your intake of caffeine and alcohol, as these substances can increase urine production and irritate the bladder.  Avoid diet, "zero calorie," and artificially sweetened drinks, especially sodas, in the afternoon or evening. Be mindful of consuming foods and drinks with high water content before bedtime, such as watermelon and herbal teas.  Time Your Medications: If you're taking medications that contribute to increased urination, consult your healthcare provider about adjusting the timing of these medications to minimize their impact during the night.  Practice Double Voiding: Before going to bed, make an effort to empty your bladder twice within a short period. This can help reduce the amount of urine left in your bladder before sleep.  Bladder Training: Gradually increase the time between bathroom visits during the day to train your bladder to hold larger volumes of urine. Over time, this can help reduce the frequency of nighttime awakenings to urinate.  Elevate Your Legs During the Day: Elevating your legs during the day can help minimize fluid retention in your lower extremities, which might reduce nighttime urination.  Pelvic Floor Exercises: Strengthening your pelvic floor muscles through Kegel exercises can help improve bladder control and potentially reduce the urge to urinate at night.  Create a Relaxing Bedtime Routine: Stress and anxiety can exacerbate nocturia. Engage in calming activities before bed, such as reading, listening to soothing music, or practicing relaxation techniques.  Stay Active: Engage in regular physical activity, but avoid intense exercise close to bedtime, as this can increase your body's demand for fluids.  Maintain a Healthy Weight: Excess weight can compress the bladder and contribute to bladder and urinary issues. Aim to achieve and maintain a healthy weight through a  balanced diet and regular exercise.  Remember that every individual is unique, and the effectiveness of these strategies may vary. It's important to work with your healthcare provider to develop a plan that suits your specific needs and addresses any underlying causes of nocturia.

## 2023-05-03 NOTE — Telephone Encounter (Signed)
Called pt informed him of the information below. Pt voiced understanding. RX sent.  °

## 2023-05-03 NOTE — Telephone Encounter (Signed)
-----   Message from Sondra Come sent at 05/03/2023 10:52 AM EDT ----- Please let him know urinalysis today was benign, we will call if culture positive.  I would recommend starting Flomax 0.4 mg nightly for his urinary symptoms as discussed in clinic.  Legrand Rams, MD 05/03/2023

## 2023-05-04 LAB — URINE CULTURE: Culture: NO GROWTH

## 2023-08-10 ENCOUNTER — Ambulatory Visit: Admitting: Urology

## 2023-08-10 VITALS — BP 157/76 | HR 72

## 2023-08-10 DIAGNOSIS — Z302 Encounter for sterilization: Secondary | ICD-10-CM | POA: Diagnosis not present

## 2023-08-10 MED ORDER — TADALAFIL 5 MG PO TABS: 5.0000 mg | ORAL_TABLET | Freq: Every day | ORAL | 11 refills | Status: AC | PRN

## 2023-08-10 NOTE — Patient Instructions (Signed)

## 2023-08-10 NOTE — Progress Notes (Signed)
 VASECTOMY PROCEDURE NOTE:  The patient was taken to the minor procedure room and placed in the supine position. His genitals were prepped and draped in the usual sterile fashion. The right vas deferens was brought up to the skin of the right upper scrotum. The skin overlying it was anesthetized with 1% lidocaine without epinephrine, anesthetic was also injected alongside the vas deferens in the direction of the inguinal canal. The no scalpel vasectomy instrument was used to make a small perforation in the scrotal skin. The vasectomy clamp was used to grasp the vas deferens. It was carefully dissected free from surrounding structures. A 1cm segment of the vas was removed, and the cut ends of the mucosa were cauterized. No significant bleeding was noted. The vas deferens was returned to the scrotum. The skin incision was closed with a simple interrupted stitch of 4-0 chromic.  Attention was then turned to the left side. The left vasectomy was performed in the same exact fashion. Sterile dressings were placed over each incision. The patient tolerated the procedure well.  IMPRESSION/DIAGNOSIS: The patient is a 68 year old gentleman who underwent a vasectomy today. Post-procedure instructions were reviewed. I stressed the importance of continuing to use birth control until he provides a semen specimen more than 2 months from now that demonstrates azoospermia.  We discussed return precautions including fever over 101, significant bleeding or hematoma, or uncontrolled pain. I also stressed the importance of avoiding strenuous activity for one week, no sexual activity or ejaculations for 5 days, intermittent icing over the next 48 hours, and scrotal support.   PLAN: The patient will be advised of his semen analysis results when available. Previously was prescribed Flomax , never started this medication and urinary symptoms not bothersome enough to consider medications at this time He was interested in a trial  of Cialis 5 to 20 mg on demand for ED  Jay Meth, MD 08/10/2023

## 2023-11-16 ENCOUNTER — Other Ambulatory Visit

## 2023-11-16 DIAGNOSIS — Z302 Encounter for sterilization: Secondary | ICD-10-CM

## 2023-11-17 LAB — POST-VAS SPERM EVALUATION,QUAL

## 2023-11-18 ENCOUNTER — Ambulatory Visit: Payer: Self-pay

## 2023-11-18 DIAGNOSIS — Z302 Encounter for sterilization: Secondary | ICD-10-CM

## 2023-11-18 NOTE — Telephone Encounter (Signed)
-----   Message from Darren Bean sent at 11/18/2023  8:02 AM EDT ----- Regarding: repeat semen analyasis Insufficient sample, need to repeat  Darren Burnet, MD 11/18/2023 ----- Message ----- From: Interface, [facility] Lab Results In Sent: 11/17/2023   4:36 PM EDT To: Darren JAYSON Burnet, MD

## 2023-11-22 ENCOUNTER — Other Ambulatory Visit

## 2023-11-22 DIAGNOSIS — Z302 Encounter for sterilization: Secondary | ICD-10-CM

## 2023-11-24 ENCOUNTER — Ambulatory Visit: Payer: Self-pay | Admitting: Urology

## 2023-11-24 LAB — POST-VAS SPERM EVALUATION,QUAL: Volume: 1.3 mL
